# Patient Record
Sex: Male | Born: 1950 | Race: White | Hispanic: No | Marital: Married | Smoking: Never smoker
Health system: Southern US, Community
[De-identification: ages and names within clinical notes are randomized; demographics above are authoritative.]

## PROBLEM LIST (undated history)

## (undated) ENCOUNTER — Ambulatory Visit: Discharge status: Absent without leave | Payer: Self-pay

## (undated) DIAGNOSIS — I10 Essential (primary) hypertension: Secondary | ICD-10-CM

## (undated) DIAGNOSIS — I77810 Thoracic aortic ectasia: Secondary | ICD-10-CM

## (undated) DIAGNOSIS — M539 Dorsopathy, unspecified: Secondary | ICD-10-CM

## (undated) DIAGNOSIS — M1990 Unspecified osteoarthritis, unspecified site: Secondary | ICD-10-CM

## (undated) DIAGNOSIS — K2270 Barrett's esophagus without dysplasia: Secondary | ICD-10-CM

## (undated) DIAGNOSIS — I351 Nonrheumatic aortic (valve) insufficiency: Secondary | ICD-10-CM

## (undated) DIAGNOSIS — E785 Hyperlipidemia, unspecified: Secondary | ICD-10-CM

## (undated) DIAGNOSIS — Q2381 Bicuspid aortic valve: Secondary | ICD-10-CM

## (undated) DIAGNOSIS — Q231 Congenital insufficiency of aortic valve: Secondary | ICD-10-CM

## (undated) HISTORY — PX: OTHER SURGICAL HISTORY: SHX169

## (undated) HISTORY — PX: JOINT REPLACEMENT: SHX530

## (undated) HISTORY — PX: TONSILLECTOMY: SUR1361

## (undated) NOTE — *Deleted (*Deleted)
PROGRESS NOTE    Jeremiah Gates  ZOX:[id number] DOB: 03-05-1951 DOA: 04/09/2020 PCP: Donell Sievert, MD    Chief Complaint  Patient presents with  . GI Bleeding    Brief Narrative:  Dark tarry stool x3 Wine/beers Last colonoscopy [facility] [place] Use mobic for years Cortisol injection to both shoulder last week  Subjective:  Assessment & Plan:   Principal Problem:   Acute upper GI bleed Active Problems:   Epigastric discomfort   Acute prerenal azotemia   Melena   Hypertension   Hyperlipidemia   Bicuspid valve followed by [facility] Dr Modesto Charon  Baseline active   DVT prophylaxis: SCDs Start: 04/09/20 1902   Code Status: Family Communication:  Disposition:   Status is: Observation       Dispo: The patient is from:               Anticipated d/c is to:               Anticipated d/c date is:               Patient currently   Consultants:     Procedures:     Antimicrobials:        Objective: Vitals:   04/10/20 0840 04/10/20 0920 04/10/20 0925 04/10/20 0925  BP: (!) 114/52 109/66  109/66  Pulse:   78 82  Resp:  17 18 18   Temp:      SpO2:   95% 95%    Intake/Output Summary (Last 24 hours) at 04/10/2020 0955 Last data filed at 04/10/2020 0456 Gross per 24 hour  Intake 181.02 ml  Output -  Net 181.02 ml   There were no vitals filed for this visit.  Examination:  General exam: calm, NAD Respiratory system: Clear to auscultation. Respiratory effort normal. Cardiovascular system: S1 & S2 heard, RRR. + 3/6 murmur right upper sternal border, No pedal edema. Gastrointestinal system: Abdomen is nondistended, soft and nontender. No organomegaly or masses felt. Normal bowel sounds heard. Central nervous system: Alert and oriented. No focal neurological deficits. Extremities: Symmetric 5 x 5 power. Skin: No rashes, lesions or ulcers Psychiatry: Judgement and insight appear normal. Mood & affect appropriate.     Data  Reviewed: I have personally reviewed following labs and imaging studies  CBC: Recent Labs  Lab 04/09/20 1300 04/09/20 1917 04/10/20 0327  WBC 9.0  --  8.6  HGB 13.7 12.6* 11.4*  HCT 38.1* 36.9* 33.3*  MCV 95.3  --  94.9  PLT 241  --  216    Basic Metabolic Panel: Recent Labs  Lab 04/09/20 1300 04/09/20 1308 04/10/20 0327  NA 136  --  136  K 4.1  --  3.7  CL 101  --  103  CO2 27  --  26  GLUCOSE 128*  --  113*  BUN 44*  --  29*  CREATININE 0.67  --  0.55*  CALCIUM 9.6  --  8.9  MG  --  2.4 2.3    GFR: CrCl cannot be calculated (Unknown ideal weight.).  Liver Function Tests: Recent Labs  Lab 04/09/20 1300 04/10/20 0327  AST 17 17  ALT 19 17  ALKPHOS 37* 30*  BILITOT 0.8 0.8  PROT 6.8 6.0*  ALBUMIN 4.6 4.0    CBG: No results for input(s): GLUCAP in the last 168 hours.   Recent Results (from the past 240 hour(s))  Respiratory Panel by RT PCR (Flu A&B, Covid) - Nasopharyngeal Swab  Status: None   Collection Time: 04/09/20  6:05 PM   Specimen: Nasopharyngeal Swab  Result Value Ref Range Status   SARS Coronavirus 2 by RT PCR NEGATIVE NEGATIVE Final    Comment: (NOTE) SARS-CoV-2 target nucleic acids are NOT DETECTED.  The SARS-CoV-2 RNA is generally detectable in upper respiratoy specimens during the acute phase of infection. The lowest concentration of SARS-CoV-2 viral copies this assay can detect is 131 copies/mL. A negative result does not preclude SARS-Cov-2 infection and should not be used as the sole basis for treatment or other patient management decisions. A negative result may occur with  improper specimen collection/handling, submission of specimen other than nasopharyngeal swab, presence of viral mutation(s) within the areas targeted by this assay, and inadequate number of viral copies (<131 copies/mL). A negative result must be combined with clinical observations, patient history, and epidemiological information. The expected result is  Negative.  Fact Sheet for Patients:  https://www.moore.com/  Fact Sheet for Healthcare Providers:  https://www.young.biz/  This test is no t yet approved or cleared by the [place] FDA and  has been authorized for detection and/or diagnosis of SARS-CoV-2 by FDA under an Emergency Use Authorization (EUA). This EUA will remain  in effect (meaning this test can be used) for the duration of the COVID-19 declaration under Section 564(b)(1) of the Act, 21 U.S.C. section 360bbb-3(b)(1), unless the authorization is terminated or revoked sooner.     Influenza A by PCR NEGATIVE NEGATIVE Final   Influenza B by PCR NEGATIVE NEGATIVE Final    Comment: (NOTE) The Xpert Xpress SARS-CoV-2/FLU/RSV assay is intended as an aid in  the diagnosis of influenza from Nasopharyngeal swab specimens and  should not be used as a sole basis for treatment. Nasal washings and  aspirates are unacceptable for Xpert Xpress SARS-CoV-2/FLU/RSV  testing.  Fact Sheet for Patients: https://www.moore.com/  Fact Sheet for Healthcare Providers: https://www.young.biz/  This test is not yet approved or cleared by the [place] FDA and  has been authorized for detection and/or diagnosis of SARS-CoV-2 by  FDA under an Emergency Use Authorization (EUA). This EUA will remain  in effect (meaning this test can be used) for the duration of the  Covid-19 declaration under Section 564(b)(1) of the Act, 21  U.S.C. section 360bbb-3(b)(1), unless the authorization is  terminated or revoked. Performed at [facility], [address], [place], [place] [address]          Radiology Studies: No results found.      Scheduled Meds: . [START ON 04/13/2020] pantoprazole  40 mg Intravenous Q12H   Continuous Infusions: . lactated ringers 75 mL/hr at 04/10/20 0917  . pantoprozole (PROTONIX) infusion 8 mg/hr (04/10/20 0649)      LOS: 0 days   Time spent: mins Greater than 50% of this time was spent in counseling, explanation of diagnosis, planning of further management, and coordination of care.  I have personally reviewed and interpreted on  04/10/2020 daily labs, tele strips, imagings as discussed above under date review session and assessment and plans.  I reviewed all nursing notes, pharmacy notes, consultant notes,  vitals, pertinent old records  I have discussed plan of care as described above with RN , patient and family on 04/10/2020  Voice Recognition /Dragon dictation system was used to create this note, attempts have been made to correct errors. Please contact the author with questions and/or clarifications.   Albertine Grates, MD PhD FACP [facility]  Available via Epic secure chat 7am-7pm for  nonurgent issues Please page for urgent issues To page the attending provider between 7A-7P or the covering provider during after hours 7P-7A, please log into the web site [email or web address] and access using universal [place] password for that web site. If you do not have the password, please call the hospital operator.    04/10/2020, 9:55 AM

---

## 2011-12-05 ENCOUNTER — Ambulatory Visit: Payer: Self-pay | Admitting: Medical

## 2012-03-31 DIAGNOSIS — I350 Nonrheumatic aortic (valve) stenosis: Secondary | ICD-10-CM | POA: Insufficient documentation

## 2012-03-31 DIAGNOSIS — I351 Nonrheumatic aortic (valve) insufficiency: Secondary | ICD-10-CM | POA: Insufficient documentation

## 2012-03-31 DIAGNOSIS — Q231 Congenital insufficiency of aortic valve: Secondary | ICD-10-CM | POA: Insufficient documentation

## 2015-08-06 ENCOUNTER — Ambulatory Visit
Admission: EM | Admit: 2015-08-06 | Discharge: 2015-08-06 | Disposition: A | Discharge status: Home / Self Care | Payer: Medicare Other | Attending: Family Medicine | Admitting: Family Medicine

## 2015-08-06 DIAGNOSIS — S61210A Laceration without foreign body of right index finger without damage to nail, initial encounter: Secondary | ICD-10-CM

## 2015-08-06 HISTORY — DX: Essential (primary) hypertension: I10

## 2015-08-06 HISTORY — DX: Unspecified osteoarthritis, unspecified site: M19.90

## 2015-08-06 MED ORDER — MUPIROCIN 2 % EX OINT: 1.0000 "application " | TOPICAL_OINTMENT | Freq: Three times a day (TID) | CUTANEOUS | Status: DC

## 2015-08-06 NOTE — Discharge Instructions (Signed)
Laceration Care, Adult  A laceration is a cut that goes through all layers of the skin. The cut also goes into the tissue that is right under the skin. Some cuts heal on their own. Others need to be closed with stitches (sutures), staples, skin adhesive strips, or wound glue. Taking care of your cut lowers your risk of infection and helps your cut to heal better.  HOW TO TAKE CARE OF YOUR CUT  For stitches or staples:  · Keep the wound clean and dry.  · If you were given a bandage (dressing), you should change it at least one time per day or as told by your doctor. You should also change it if it gets wet or dirty.  · Keep the wound completely dry for the first 24 hours or as told by your doctor. After that time, you may take a shower or a bath. However, make sure that the wound is not soaked in water until after the stitches or staples have been removed.  · Clean the wound one time each day or as told by your doctor:    Wash the wound with soap and water.    Rinse the wound with water until all of the soap comes off.    Pat the wound dry with a clean towel. Do not rub the wound.  · After you clean the wound, put a thin layer of antibiotic ointment on it as told by your doctor. This ointment:    Helps to prevent infection.    Keeps the bandage from sticking to the wound.  · Have your stitches or staples removed as told by your doctor.  If your doctor used skin adhesive strips:   · Keep the wound clean and dry.  · If you were given a bandage, you should change it at least one time per day or as told by your doctor. You should also change it if it gets dirty or wet.  · Do not get the skin adhesive strips wet. You can take a shower or a bath, but be careful to keep the wound dry.  · If the wound gets wet, pat it dry with a clean towel. Do not rub the wound.  · Skin adhesive strips fall off on their own. You can trim the strips as the wound heals. Do not remove any strips that are still stuck to the wound. They will  fall off after a while.  If your doctor used wound glue:  · Try to keep your wound dry, but you may briefly wet it in the shower or bath. Do not soak the wound in water, such as by swimming.  · After you take a shower or a bath, gently pat the wound dry with a clean towel. Do not rub the wound.  · Do not do any activities that will make you really sweaty until the skin glue has fallen off on its own.  · Do not apply liquid, cream, or ointment medicine to your wound while the skin glue is still on.  · If you were given a bandage, you should change it at least one time per day or as told by your doctor. You should also change it if it gets dirty or wet.  · If a bandage is placed over the wound, do not let the tape for the bandage touch the skin glue.  · Do not pick at the glue. The skin glue usually stays on for 5-10 days. Then, it   falls off of the skin.  General Instructions   · To help prevent scarring, make sure to cover your wound with sunscreen whenever you are outside after stitches are removed, after adhesive strips are removed, or when wound glue stays in place and the wound is healed. Make sure to wear a sunscreen of at least 30 SPF.  · Take over-the-counter and prescription medicines only as told by your doctor.  · If you were given antibiotic medicine or ointment, take or apply it as told by your doctor. Do not stop using the antibiotic even if your wound is getting better.  · Do not scratch or pick at the wound.  · Keep all follow-up visits as told by your doctor. This is important.  · Check your wound every day for signs of infection. Watch for:    Redness, swelling, or pain.    Fluid, blood, or pus.  · Raise (elevate) the injured area above the level of your heart while you are sitting or lying down, if possible.  GET HELP IF:  · You got a tetanus shot and you have any of these problems at the injection site:    Swelling.    Very bad pain.    Redness.    Bleeding.  · You have a fever.  · A wound that was  closed breaks open.  · You notice a bad smell coming from your wound or your bandage.  · You notice something coming out of the wound, such as wood or glass.  · Medicine does not help your pain.  · You have more redness, swelling, or pain at the site of your wound.  · You have fluid, blood, or pus coming from your wound.  · You notice a change in the color of your skin near your wound.  · You need to change the bandage often because fluid, blood, or pus is coming from the wound.  · You start to have a new rash.  · You start to have numbness around the wound.  GET HELP RIGHT AWAY IF:  · You have very bad swelling around the wound.  · Your pain suddenly gets worse and is very bad.  · You notice painful lumps near the wound or on skin that is anywhere on your body.  · You have a red streak going away from your wound.  · The wound is on your hand or foot and you cannot move a finger or toe like you usually can.  · The wound is on your hand or foot and you notice that your fingers or toes look pale or bluish.     This information is not intended to replace advice given to you by your health care provider. Make sure you discuss any questions you have with your health care provider.     Document Released: 11/18/2007 Document Revised: 10/16/2014 Document Reviewed: 05/28/2014  Elsevier Interactive Patient Education ©2016 [employer]

## 2015-08-06 NOTE — ED Notes (Signed)
Attempted to catch a falling kitchen knife around 10 am this morning, knife lacerated right finger joint. Bleeding controlled as long as finger remains bent

## 2015-08-06 NOTE — ED Provider Notes (Signed)
CSN: [id number]     Arrival date & time 08/06/15  1047 History   First MD Initiated Contact with Patient 08/06/15 1333    Nurses notes were reviewed. Chief Complaint  Patient presents with  . Laceration   (Consider location/radiation/quality/duration/timing/severity/associated sxs/prior Treatment) Patient is a 65 y.o. male presenting with hand pain. The history is provided by the patient. No language interpreter was used.  Hand Pain This is a new problem. The current episode started 3 to 5 hours ago. The problem occurs constantly. The problem has not changed since onset.Pertinent negatives include no chest pain, no abdominal pain, no headaches and no shortness of breath. Nothing aggravates the symptoms. Nothing relieves the symptoms. He has tried a cold compress for the symptoms. The treatment provided no relief.   patient reports washing some dishes and a sharp kitchen knife was falling. Unfortunately try to catch it with his hand and one up lacerating his right index finger. He reports last tetanus about 4 years ago he denies any other major problems. Still bleeding so he came in to be seen and evaluated.   Past Medical History  Diagnosis Date  . Hypertension   . Arthritis    Past Surgical History  Procedure Laterality Date  . Joint replacement     Family History  Problem Relation Age of Onset  . Heart failure Father   . Stroke Father    Social History  Substance Use Topics  . Smoking status: Never Smoker   . Smokeless tobacco: None  . Alcohol Use: Yes     Comment: socially    Review of Systems  Respiratory: Negative for shortness of breath.   Cardiovascular: Negative for chest pain.  Gastrointestinal: Negative for abdominal pain.  Skin: Positive for wound.  Neurological: Negative for headaches.  All other systems reviewed and are negative.   Allergies  Review of patient's allergies indicates no known allergies.  Home Medications   Prior to Admission medications     Medication Sig Start Date End Date Taking? Authorizing Provider  aspirin EC 325 MG tablet Take 325 mg by mouth daily.   Yes Historical Provider, MD  hydrochlorothiazide (HYDRODIURIL) 25 MG tablet Take 25 mg by mouth daily.   Yes Historical Provider, MD  lisinopril (PRINIVIL,ZESTRIL) 10 MG tablet Take 10 mg by mouth daily.   Yes Historical Provider, MD  meloxicam (MOBIC) 15 MG tablet Take 15 mg by mouth daily.   Yes Historical Provider, MD  mupirocin ointment (BACTROBAN) 2 % Apply 1 application topically 3 (three) times daily. 08/06/15   Frederich Cha, MD   Meds Ordered and Administered this Visit  Medications - No data to display  BP 162/70 mmHg  Pulse 52  Temp(Src) 96.2 F (35.7 C) (Tympanic)  Resp 178  Ht 6' (1.829 m)  Wt 215 lb (97.523 kg)  BMI 29.15 kg/m2  SpO2 97% No data found.   Physical Exam  Constitutional: He is oriented to person, place, and time. He appears well-developed and well-nourished.  HENT:  Head: Normocephalic.  Eyes: Pupils are equal, round, and reactive to light.  Musculoskeletal: Normal range of motion. He exhibits tenderness.       Hands: Substance a semicircular laceration right adnexa finger over most the palmar surface.  Neurological: He is alert and oriented to person, place, and time.  Skin: Skin is warm and dry.  Psychiatric: He has a normal mood and affect.  Vitals reviewed.   ED Course  .Marland KitchenLaceration Repair Date/Time: 08/06/2015 2:17 PM Performed by:  Kaydenn Mclear Authorized by: Frederich Cha Consent: Verbal consent obtained. Consent given by: patient Body area: upper extremity Laceration length: 4 cm Foreign bodies: no foreign bodies Tendon involvement: none Nerve involvement: none Vascular damage: no Anesthesia: local infiltration Local anesthetic: lidocaine 1% without epinephrine Patient sedated: no Preparation: Patient was prepped and draped in the usual sterile fashion. Amount of cleaning: standard Debridement: none Degree of  undermining: none Skin closure: 3-0 nylon and Ethilon Technique: simple Approximation: close Approximation difficulty: simple Dressing: antibiotic ointment Patient tolerance: Patient tolerated the procedure well with no immediate complications Comments: Patient tolerated the repair well such return in 12 days for follow-up.   (including critical care time)  Labs Review Labs Reviewed - No data to display  Imaging Review No results found.   Visual Acuity Review  Right Eye Distance:   Left Eye Distance:   Bilateral Distance:    Right Eye Near:   Left Eye Near:    Bilateral Near:         MDM   1. Laceration of right index finger w/o foreign body w/o damage to nail, initial encounter    Repair of the right index finger done. We'll place him Bactroban ointment to 3 times a day routine office follow-up in 12 days for suture removal. Recommend ointment also prescribed the patient for wound care.   Note: This dictation was prepared with Dragon dictation along with smaller phrase technology. Any transcriptional errors that result from this process are unintentional.     Frederich Cha, MD 08/06/15 2239

## 2016-02-05 DIAGNOSIS — Z96653 Presence of artificial knee joint, bilateral: Secondary | ICD-10-CM | POA: Insufficient documentation

## 2016-12-25 ENCOUNTER — Ambulatory Visit
Admission: RE | Admit: 2016-12-25 | Discharge: 2016-12-25 | Disposition: A | Discharge status: Home / Self Care | Payer: Medicare Other | Source: Ambulatory Visit | Attending: Family Medicine | Admitting: Family Medicine

## 2016-12-25 ENCOUNTER — Ambulatory Visit
Admission: EM | Admit: 2016-12-25 | Discharge: 2016-12-25 | Disposition: A | Discharge status: Home / Self Care | Payer: Medicare Other | Attending: Family Medicine | Admitting: Family Medicine

## 2016-12-25 DIAGNOSIS — M79662 Pain in left lower leg: Secondary | ICD-10-CM | POA: Diagnosis not present

## 2016-12-25 HISTORY — DX: Hyperlipidemia, unspecified: E78.5

## 2016-12-25 NOTE — ED Triage Notes (Signed)
66 year old Caucasian male is here today with complaints of left calf tenderness that he noticed yesterday. He states he does not remember injuring his left leg or calf in any way. He denies any shortness of breath or  chest pain.

## 2016-12-25 NOTE — ED Provider Notes (Signed)
[facility]    CSN: [id number] Arrival date & time: 12/25/16  1249     History   Chief Complaint Chief Complaint  Patient presents with  . Leg Pain    Left    HPI Jeremiah Gates is a 66 y.o. male.   66 yo male with a c/o sudden onset of left calf pain and slight swelling since yesterday. States he does not recall any specific injury. Denies any chest pain or shortness of breath.    The history is provided by the patient.    Past Medical History:  Diagnosis Date  . Arthritis   . Hyperlipidemia   . Hypertension     There are no active problems to display for this patient.   Past Surgical History:  Procedure Laterality Date  . JOINT REPLACEMENT         Home Medications    Prior to Admission medications   Medication Sig Start Date End Date Taking? Authorizing Provider  aspirin EC 325 MG tablet Take 325 mg by mouth daily.   Yes [provider]  hydrochlorothiazide (HYDRODIURIL) 25 MG tablet Take 25 mg by mouth daily.   Yes [provider]  lisinopril (PRINIVIL,ZESTRIL) 10 MG tablet Take 10 mg by mouth daily.   Yes [provider]  lovastatin (MEVACOR) 10 MG tablet Take 10 mg by mouth at bedtime.   Yes [provider]  meloxicam (MOBIC) 15 MG tablet Take 15 mg by mouth daily.   Yes [provider]    Family History Family History  Problem Relation Age of Onset  . Heart failure Father   . Stroke Father     Social History Social History  Substance Use Topics  . Smoking status: Never Smoker  . Smokeless tobacco: Never Used  . Alcohol use Yes     Comment: socially     Allergies   Patient has no known allergies.   Review of Systems Review of Systems   Physical Exam Triage Vital Signs ED Triage Vitals  Enc Vitals Group     BP 12/25/16 1322 (!) 148/63     Pulse Rate 12/25/16 1322 79     Resp 12/25/16 1322 16     Temp 12/25/16 1322 98.5 F (36.9 C)     Temp Source 12/25/16 1322 Oral       SpO2 12/25/16 1322 96 %     Weight 12/25/16 1325 232 lb (105.2 kg)     Height 12/25/16 1325 5\' 11"  (1.803 m)     Head Circumference --      Peak Flow --      Pain Score 12/25/16 1326 2     Pain Loc --      Pain Edu? --      Excl. in [place]? --    No data found.   Updated Vital Signs BP (!) 148/63 (BP Location: Left Arm)   Pulse 79   Temp 98.5 F (36.9 C) (Oral)   Resp 16   Ht 5\' 11"  (1.803 m)   Wt 232 lb (105.2 kg)   SpO2 96%   BMI 32.36 kg/m   Visual Acuity Right Eye Distance:   Left Eye Distance:   Bilateral Distance:    Right Eye Near:   Left Eye Near:    Bilateral Near:     Physical Exam  Constitutional: He appears well-developed and well-nourished. No distress.  Musculoskeletal: He exhibits tenderness.  Left calf tenderness to palpation; mild edema; 1+ distal pulses  Skin: He is not diaphoretic.  Nursing note and vitals reviewed.    UC Treatments / Results  Labs (all labs ordered are listed, but only abnormal results are displayed) Labs Reviewed - No data to display  EKG  EKG Interpretation None       Radiology No results found.  Procedures Procedures (including critical care time)  Medications Ordered in UC Medications - No data to display   Initial Impression / Assessment and Plan / UC Course  I have reviewed the triage vital signs and the nursing notes.  Pertinent labs & imaging results that were available during my care of the patient were reviewed by me and considered in my medical decision making (see chart for details).       Final Clinical Impressions(s) / UC Diagnoses   Final diagnoses:  Pain of left calf    New Prescriptions Discharge Medication List as of 12/25/2016  2:20 PM     1. [place] results (negative for DVT) and diagnosis reviewed with patient 2. Recommend supportive treatment with otc analgesics prn; stretch 3. Follow-up prn if symptoms worsen or don't improve   Norval Gable, MD 12/29/16 [phone]

## 2017-02-09 DIAGNOSIS — F5101 Primary insomnia: Secondary | ICD-10-CM | POA: Insufficient documentation

## 2017-05-16 ENCOUNTER — Other Ambulatory Visit: Payer: Self-pay

## 2017-05-16 ENCOUNTER — Ambulatory Visit
Admission: EM | Admit: 2017-05-16 | Discharge: 2017-05-16 | Disposition: A | Discharge status: Home / Self Care | Payer: Medicare Other | Attending: Emergency Medicine | Admitting: Emergency Medicine

## 2017-05-16 DIAGNOSIS — K120 Recurrent oral aphthae: Secondary | ICD-10-CM | POA: Diagnosis not present

## 2017-05-16 HISTORY — DX: Bicuspid aortic valve: Q23.81

## 2017-05-16 HISTORY — DX: Congenital insufficiency of aortic valve: Q23.1

## 2017-05-16 MED ORDER — TRIAMCINOLONE ACETONIDE 0.1 % MT PSTE: 1.0000 "application " | PASTE | Freq: Two times a day (BID) | OROMUCOSAL | 0 refills | Status: DC

## 2017-05-16 MED ORDER — PENICILLIN V POTASSIUM 500 MG PO TABS: 500.0000 mg | ORAL_TABLET | Freq: Four times a day (QID) | ORAL | 0 refills | Status: AC

## 2017-05-16 NOTE — ED Provider Notes (Signed)
HPI  SUBJECTIVE:  Jeremiah Gates is a 66 y.o. male who presents with left upper gum pain starting 2 days ago.  He reports tenderness to palpation, gingival swelling.  He denies pain unless he is touching it.  He denies dental pain.  No facial swelling, drainage, bad breath, trauma to the area.  No fevers, body aches.  He started himself on amoxicillin, took 1 g last night and 500 mg this morning, also took Naprosyn.  As the amoxicillin for prophylactic dental work as he has a bicuspid aortic valve.  no alleviating factors.  Symptoms are worse with palpation.  No history of diabetes.  He has history of high blood pressure.  PMD: [facility].  Dentist: Erline Hau in [place].    Past Medical History:  Diagnosis Date  . Arthritis   . Bicuspid aortic valve   . Hyperlipidemia   . Hypertension     Past Surgical History:  Procedure Laterality Date  . JOINT REPLACEMENT      Family History  Problem Relation Age of Onset  . Heart failure Father   . Stroke Father     Social History   Tobacco Use  . Smoking status: Never Smoker  . Smokeless tobacco: Never Used  Substance Use Topics  . Alcohol use: Yes    Comment: socially  . Drug use: No    No current facility-administered medications for this encounter.   Current Outpatient Medications:  .  aspirin EC 325 MG tablet, Take 325 mg by mouth daily., Disp: , Rfl:  .  hydrochlorothiazide (HYDRODIURIL) 25 MG tablet, Take 25 mg by mouth daily., Disp: , Rfl:  .  lisinopril (PRINIVIL,ZESTRIL) 10 MG tablet, Take 10 mg by mouth daily., Disp: , Rfl:  .  lovastatin (MEVACOR) 10 MG tablet, Take 10 mg by mouth at bedtime., Disp: , Rfl:  .  meloxicam (MOBIC) 15 MG tablet, Take 15 mg by mouth daily., Disp: , Rfl:  .  penicillin v potassium (VEETID) 500 MG tablet, Take 1 tablet (500 mg total) by mouth 4 (four) times daily for 7 days., Disp: 28 tablet, Rfl: 0 .  triamcinolone (KENALOG) 0.1 % paste, Use as directed 1 application in the mouth or  throat 2 (two) times daily., Disp: 5 g, Rfl: 0  No Known Allergies   ROS  As noted in HPI.   Physical Exam  BP 128/72 (BP Location: Left Arm)   Pulse 74   Temp 98.2 F (36.8 C) (Oral)   Ht 5\' 10"  (1.778 m)   Wt 218 lb (98.9 kg)   SpO2 97%   BMI 31.28 kg/m   Constitutional: Well developed, well nourished, no acute distress Eyes:  EOMI, conjunctiva normal bilaterally HENT: Normocephalic, atraumatic,mucus membranes moist.  Abscess ulcer at the junction of the gum and buccal mucosa left upper jaw.  Positive tenderness, erythema, swelling.  No fluctuance, induration.  No expressible purulent drainage.  No dental tenderness. Respiratory: Normal inspiratory effort Cardiovascular: Normal rate GI: nondistended skin: No rash, skin intact Musculoskeletal: no deformities Neurologic: Alert & oriented x 3, no focal neuro deficits Psychiatric: Speech and behavior appropriate   ED Course   Medications - No data to display  No orders of the defined types were placed in this encounter.   No results found for this or any previous visit (from the past 24 hour(s)). No results found.  ED Clinical Impression  Ulcer aphthous oral   ED Assessment/Plan  [place] Narcotic database reviewed for this patient, and feel that  the risk/benefit ratio today is favorable for proceeding with a prescription for controlled substance.  No opiate prescriptions in 2 years.  Presentation consistent with an aphthous ulcer.  Home with triamcinolone dental paste, salt water gargles, Listerine rinses and some penicillin for prophylaxis given that he has a history of bicuspid aortic valve.  He will follow-up with his dentist in several days if not getting any better.  Discussed findings, medical decision making, plan for follow-up with patient.. patient agrees with plan.   Meds ordered this encounter  Medications  . penicillin v potassium (VEETID) 500 MG tablet    Sig: Take 1 tablet (500 mg total) by mouth 4  (four) times daily for 7 days.    Dispense:  28 tablet    Refill:  0  . triamcinolone (KENALOG) 0.1 % paste    Sig: Use as directed 1 application in the mouth or throat 2 (two) times daily.    Dispense:  5 g    Refill:  0    *This clinic note was created using [employer]. Therefore, there may be occasional mistakes despite careful proofreading.   ?    Melynda Ripple, MD 05/17/17 [phone]

## 2017-05-16 NOTE — Discharge Instructions (Signed)
Salt water rinses, Listerine rinses, apply the paste 1-2 times a day.

## 2017-05-16 NOTE — ED Triage Notes (Signed)
Patient c/o left upper gum pain, he is unsure if it is from his tooth. Pain started Friday.

## 2017-09-13 IMAGING — US US EXTREM LOW VENOUS*L*
1 series · 13 of 24 positions shown · non-contrast
Comparison: None.

CLINICAL DATA: Acute left calf pain. Left leg pain and swelling
starting yesterday.



[Series 1: us extrem low venous*left* · 0.08mm/px · 13 of 34 slices shown]
[im 1/34]
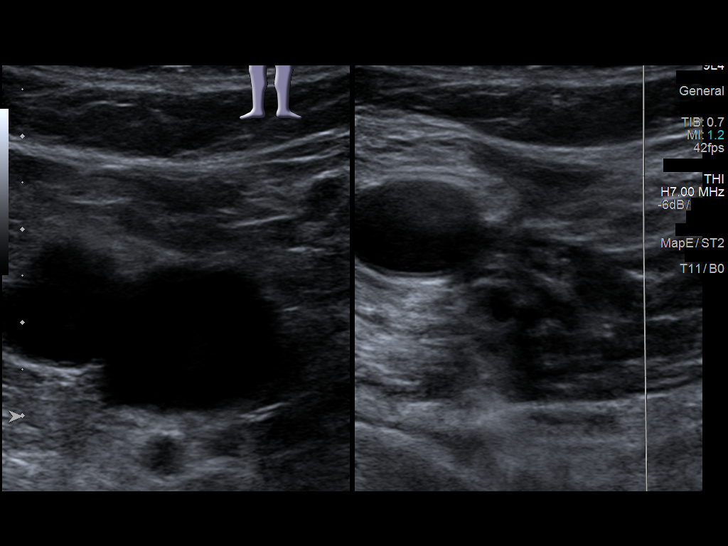
[im 3/34]
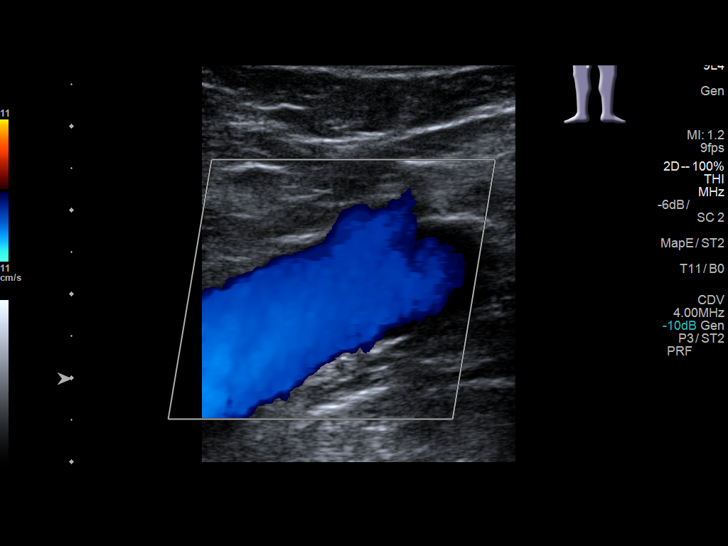
[im 6/34]
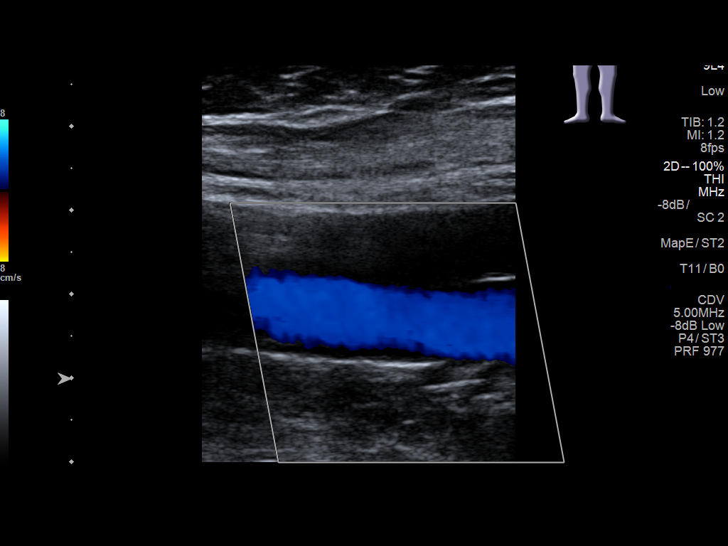
[im 9/34]
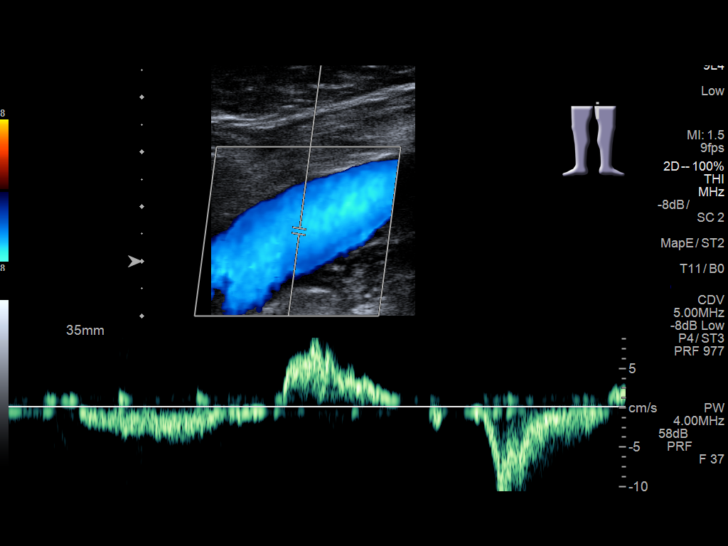
[im 12/34]
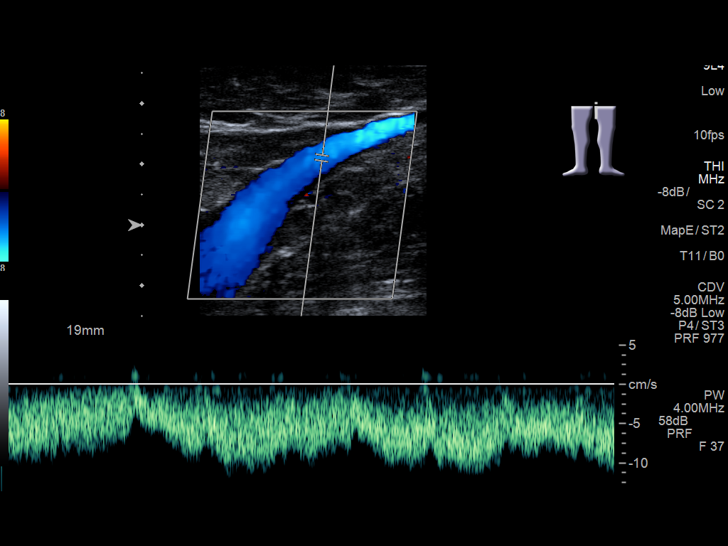
[im 15/34]
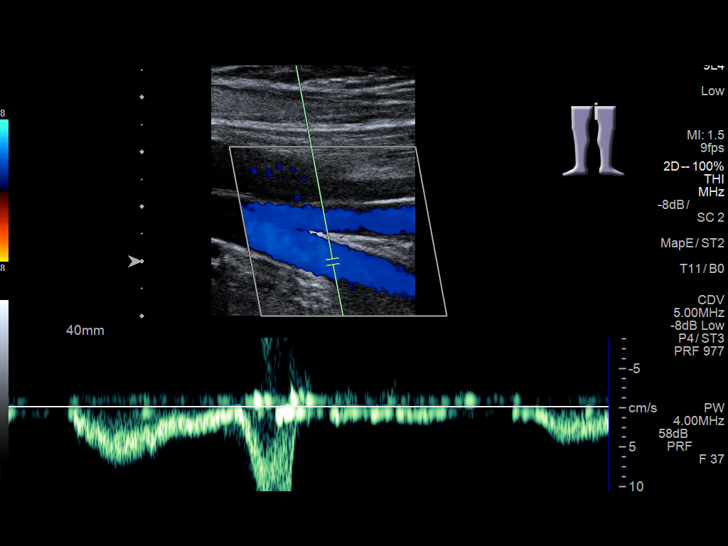
[im 18/34]
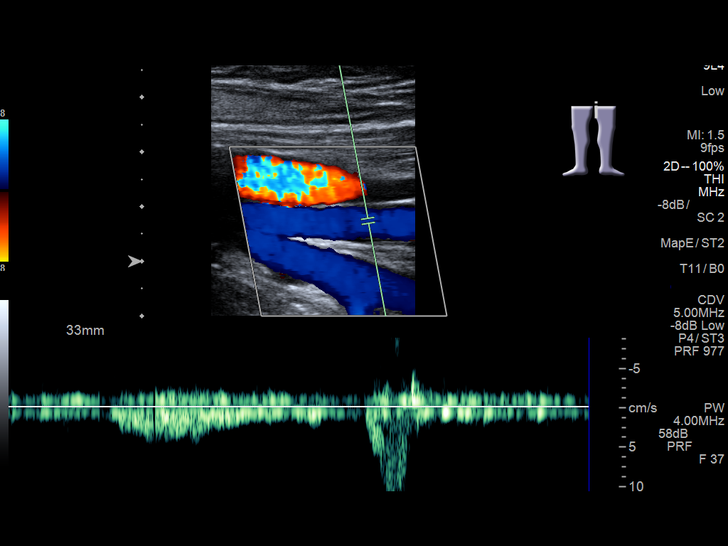
[im 19/34]
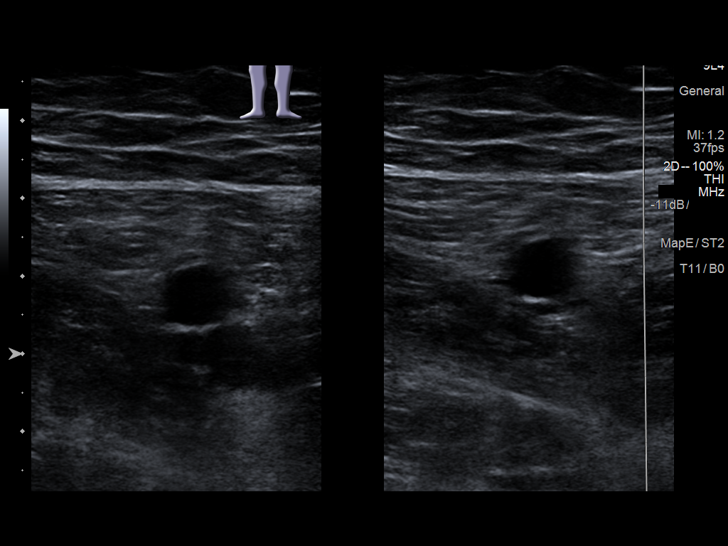
[im 22/34]
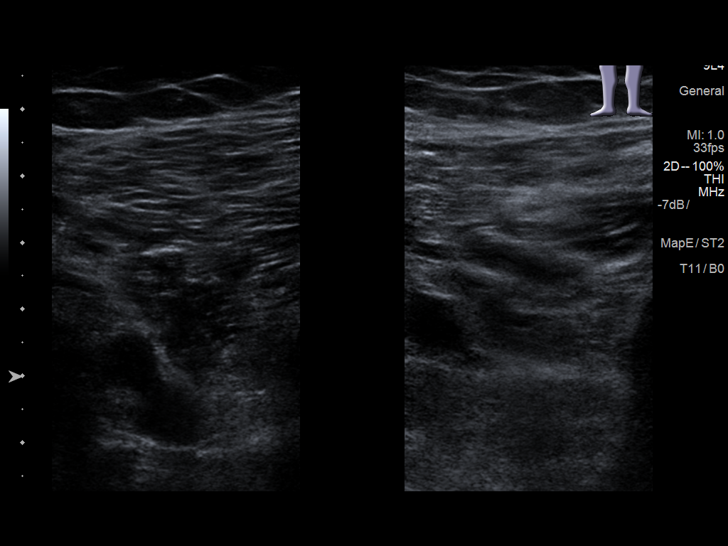
[im 25/34]
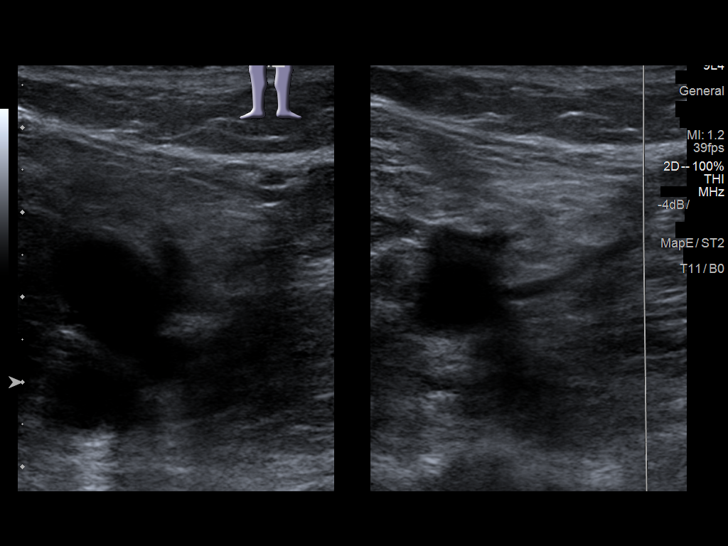
[im 28/34]
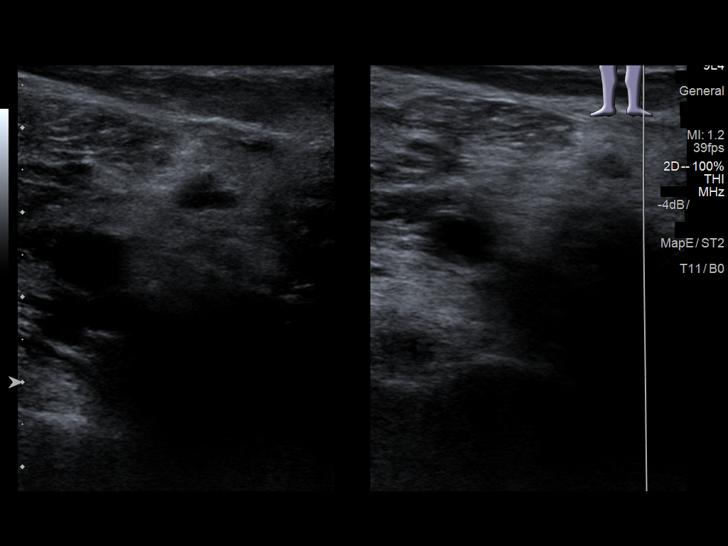
[im 31/34]
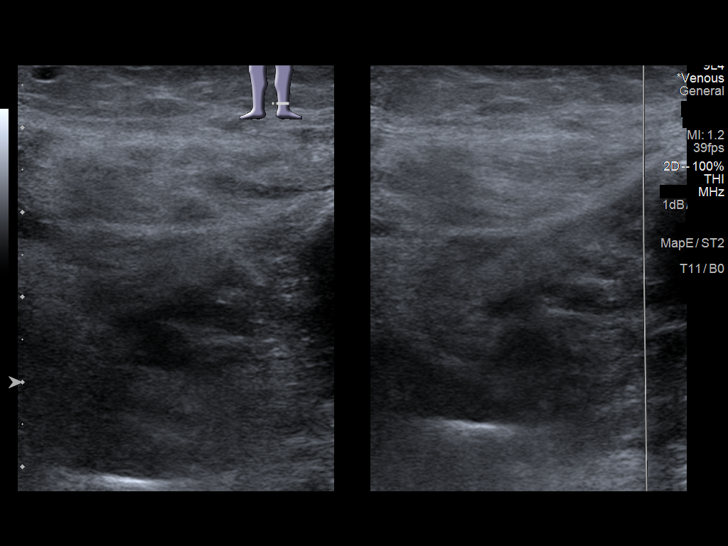
[im 34/34]
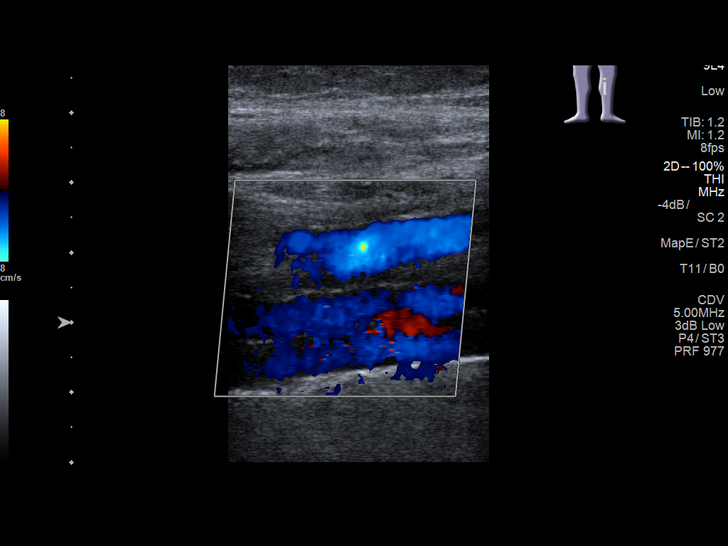

[13 of 24 positions shown; findings below may reference images not displayed]

FINDINGS: Contralateral Common Femoral Vein: Respiratory phasicity is normal
and symmetric with the symptomatic side. No evidence of thrombus.
Normal compressibility.

Common Femoral Vein: No evidence of thrombus. Normal
compressibility, respiratory phasicity and response to augmentation.

Saphenofemoral Junction: No evidence of thrombus. Normal
compressibility and flow on color Doppler imaging.

Profunda Femoral Vein: No evidence of thrombus. Normal
compressibility and flow on color Doppler imaging.

Femoral Vein: No evidence of thrombus. Normal compressibility,
respiratory phasicity and response to augmentation.

Popliteal Vein: No evidence of thrombus. Normal compressibility,
respiratory phasicity and response to augmentation.

Calf Veins: No evidence of thrombus. Normal compressibility and flow
on color Doppler imaging.

Superficial Great Saphenous Vein: No evidence of thrombus. Normal
compressibility and flow on color Doppler imaging.

Venous Reflux:  None.

Other Findings:  None.
IMPRESSION: No evidence of DVT within the left lower extremity.

These results will be called to the ordering clinician or
representative by the [HOSPITAL] at the imaging location.

## 2019-03-31 DIAGNOSIS — I77810 Thoracic aortic ectasia: Secondary | ICD-10-CM | POA: Insufficient documentation

## 2020-02-28 HISTORY — DX: Hypercalcemia: E83.52

## 2020-04-09 ENCOUNTER — Ambulatory Visit
Admission: EM | Admit: 2020-04-09 | Discharge: 2020-04-10 | Disposition: A | Discharge status: Home / Self Care | Payer: Medicare PPO | Attending: Internal Medicine | Admitting: Internal Medicine

## 2020-04-09 ENCOUNTER — Encounter: Payer: Self-pay | Admitting: Emergency Medicine

## 2020-04-09 DIAGNOSIS — E785 Hyperlipidemia, unspecified: Secondary | ICD-10-CM | POA: Diagnosis not present

## 2020-04-09 DIAGNOSIS — R1013 Epigastric pain: Secondary | ICD-10-CM | POA: Diagnosis not present

## 2020-04-09 DIAGNOSIS — K2289 Other specified disease of esophagus: Secondary | ICD-10-CM | POA: Insufficient documentation

## 2020-04-09 DIAGNOSIS — K921 Melena: Secondary | ICD-10-CM | POA: Diagnosis not present

## 2020-04-09 DIAGNOSIS — N19 Unspecified kidney failure: Secondary | ICD-10-CM | POA: Diagnosis present

## 2020-04-09 DIAGNOSIS — K259 Gastric ulcer, unspecified as acute or chronic, without hemorrhage or perforation: Secondary | ICD-10-CM | POA: Insufficient documentation

## 2020-04-09 DIAGNOSIS — K922 Gastrointestinal hemorrhage, unspecified: Secondary | ICD-10-CM | POA: Diagnosis not present

## 2020-04-09 DIAGNOSIS — I10 Essential (primary) hypertension: Secondary | ICD-10-CM | POA: Diagnosis present

## 2020-04-09 DIAGNOSIS — Z20822 Contact with and (suspected) exposure to covid-19: Secondary | ICD-10-CM | POA: Insufficient documentation

## 2020-04-09 LAB — CBC
HCT: 38.1 % — ABNORMAL LOW (ref 39.0–52.0)
Hemoglobin: 13.7 g/dL (ref 13.0–17.0)
MCH: 34.3 pg — ABNORMAL HIGH (ref 26.0–34.0)
MCHC: 36 g/dL (ref 30.0–36.0)
MCV: 95.3 fL (ref 80.0–100.0)
Platelets: 241 10*3/uL (ref 150–400)
RBC: 4 MIL/uL — ABNORMAL LOW (ref 4.22–5.81)
RDW: 12.8 % (ref 11.5–15.5)
WBC: 9 10*3/uL (ref 4.0–10.5)
nRBC: 0 % (ref 0.0–0.2)

## 2020-04-09 LAB — ABO/RH: ABO/RH(D): O NEG

## 2020-04-09 LAB — RESPIRATORY PANEL BY RT PCR (FLU A&B, COVID)
Influenza A by PCR: NEGATIVE
Influenza B by PCR: NEGATIVE
SARS Coronavirus 2 by RT PCR: NEGATIVE

## 2020-04-09 LAB — COMPREHENSIVE METABOLIC PANEL
ALT: 19 U/L (ref 0–44)
AST: 17 U/L (ref 15–41)
Albumin: 4.6 g/dL (ref 3.5–5.0)
Alkaline Phosphatase: 37 U/L — ABNORMAL LOW (ref 38–126)
Anion gap: 8 (ref 5–15)
BUN: 44 mg/dL — ABNORMAL HIGH (ref 8–23)
CO2: 27 mmol/L (ref 22–32)
Calcium: 9.6 mg/dL (ref 8.9–10.3)
Chloride: 101 mmol/L (ref 98–111)
Creatinine, Ser: 0.67 mg/dL (ref 0.61–1.24)
GFR, Estimated: >60 mL/min (ref >60)
Glucose, Bld: 128 mg/dL — ABNORMAL HIGH (ref 70–99)
Potassium: 4.1 mmol/L (ref 3.5–5.1)
Sodium: 136 mmol/L (ref 135–145)
Total Bilirubin: 0.8 mg/dL (ref 0.3–1.2)
Total Protein: 6.8 g/dL (ref 6.5–8.1)

## 2020-04-09 LAB — HEMOGLOBIN AND HEMATOCRIT, BLOOD
HCT: 36.9 % — ABNORMAL LOW (ref 39.0–52.0)
Hemoglobin: 12.6 g/dL — ABNORMAL LOW (ref 13.0–17.0)

## 2020-04-09 LAB — TYPE AND SCREEN: Unit division: 0

## 2020-04-09 LAB — MAGNESIUM: Magnesium: 2.4 mg/dL (ref 1.7–2.4)

## 2020-04-09 MED ORDER — LACTATED RINGERS IV SOLN: INTRAVENOUS | Status: DC

## 2020-04-09 MED ORDER — FENTANYL CITRATE (PF) 100 MCG/2ML IJ SOLN: 25.0000 ug | INTRAMUSCULAR | Status: DC | PRN

## 2020-04-09 MED ORDER — PANTOPRAZOLE SODIUM 40 MG IV SOLR: 40.0000 mg | Freq: Two times a day (BID) | INTRAVENOUS | Status: DC

## 2020-04-09 MED ORDER — ONDANSETRON HCL 4 MG/2ML IJ SOLN: 4.0000 mg | Freq: Four times a day (QID) | INTRAMUSCULAR | Status: DC | PRN

## 2020-04-09 MED ORDER — PANTOPRAZOLE SODIUM 40 MG IV SOLR
80.0000 mg | Freq: Once | INTRAVENOUS | Status: AC
  Administered 2020-04-09: 80 mg via INTRAVENOUS
  Filled 2020-04-09: qty 80

## 2020-04-09 MED ORDER — SODIUM CHLORIDE 0.9 % IV SOLN
8.0000 mg/h | INTRAVENOUS | Status: DC
  Administered 2020-04-09 – 2020-04-10 (×2): 8 mg/h via INTRAVENOUS
  Filled 2020-04-09 (×2): qty 80

## 2020-04-09 NOTE — ED Provider Notes (Signed)
[facility] Emergency Department Provider Note    First MD Initiated Contact with Patient 04/09/20 1629     (approximate)  I have reviewed the triage vital signs and the nursing notes.   HISTORY  Chief Complaint GI Bleeding    HPI Jeremiah Gates is a 69 y.o. male below's past medical history presents to the ER for multiple episodes of dark tarry stools starting today. Patient is on aspirin has been taking meloxicam for shoulder pain and arthritis. Denies any history of ulcer. Does have a history of bleeding hemorrhoid. Is not had any right red blood per rectum. Denies any lower abdominal pain. Has been having some indigestion and epigastric pain over the past 24 to 48 hours.    Past Medical History:  Diagnosis Date  . Arthritis   . Bicuspid aortic valve   . Hyperlipidemia   . Hypertension    Family History  Problem Relation Age of Onset  . Heart failure Father   . Stroke Father    Past Surgical History:  Procedure Laterality Date  . JOINT REPLACEMENT     There are no problems to display for this patient.     Prior to Admission medications   Medication Sig Start Date End Date Taking? Authorizing Provider  aspirin EC 325 MG tablet Take 325 mg by mouth daily.    [provider]  hydrochlorothiazide (HYDRODIURIL) 25 MG tablet Take 25 mg by mouth daily.    [provider]  lisinopril (PRINIVIL,ZESTRIL) 10 MG tablet Take 10 mg by mouth daily.    [provider]  lovastatin (MEVACOR) 10 MG tablet Take 10 mg by mouth at bedtime.    [provider]  meloxicam (MOBIC) 15 MG tablet Take 15 mg by mouth daily.    [provider]  triamcinolone (KENALOG) 0.1 % paste Use as directed 1 application in the mouth or throat 2 (two) times daily. 05/16/17   Melynda Ripple, MD    Allergies Patient has no known allergies.    Social History Social History   Tobacco Use  . Smoking status: Never Smoker  .  Smokeless tobacco: Never Used  Substance Use Topics  . Alcohol use: Yes    Comment: socially  . Drug use: No    Review of Systems Patient denies headaches, rhinorrhea, blurry vision, numbness, shortness of breath, chest pain, edema, cough, abdominal pain, nausea, vomiting, diarrhea, dysuria, fevers, rashes or hallucinations unless otherwise stated above in HPI. ____________________________________________   PHYSICAL EXAM:  VITAL SIGNS: Vitals:   04/09/20 1253 04/09/20 1738  BP: 126/76 129/66  Pulse: 95 88  Resp: 18 18  Temp: 98.5 F (36.9 C)   SpO2: 97% 98%    Constitutional: Alert and oriented.  Eyes: Conjunctivae are normal.  Head: Atraumatic. Nose: No congestion/rhinnorhea. Mouth/Throat: Mucous membranes are moist.   Neck: No stridor. Painless ROM.  Cardiovascular: Normal rate, regular rhythm. Grossly normal heart sounds.  Good peripheral circulation. Respiratory: Normal respiratory effort.  No retractions. Lungs CTAB. Gastrointestinal: Soft and nontender. No distention. No abdominal bruits. No CVA tenderness. Genitourinary: Dark melanotic stool on DRE. Musculoskeletal: No lower extremity tenderness nor edema.  No joint effusions. Neurologic:  Normal speech and language. No gross focal neurologic deficits are appreciated. No facial droop Skin:  Skin is warm, dry and intact. No rash noted. Psychiatric: Mood and affect are normal. Speech and behavior are normal.  ____________________________________________   LABS (all labs ordered are listed, but only abnormal results are displayed)  Results for orders placed or performed during the hospital encounter of 04/09/20 (from the past 24 hour(s))  Comprehensive metabolic panel     Status: Abnormal   Collection Time: 04/09/20  1:00 PM  Result Value Ref Range   Sodium 136 135 - 145 mmol/L   Potassium 4.1 3.5 - 5.1 mmol/L   Chloride 101 98 - 111 mmol/L   CO2 27 22 - 32 mmol/L   Glucose, Bld 128 (H) 70 - 99 mg/dL   BUN 44  (H) 8 - 23 mg/dL   Creatinine, Ser 0.67 0.61 - 1.24 mg/dL   Calcium 9.6 8.9 - 10.3 mg/dL   Total Protein 6.8 6.5 - 8.1 g/dL   Albumin 4.6 3.5 - 5.0 g/dL   AST 17 15 - 41 U/L   ALT 19 0 - 44 U/L   Alkaline Phosphatase 37 (L) 38 - 126 U/L   Total Bilirubin 0.8 0.3 - 1.2 mg/dL   GFR, Estimated >60 >60 mL/min   Anion gap 8 5 - 15  CBC     Status: Abnormal   Collection Time: 04/09/20  1:00 PM  Result Value Ref Range   WBC 9.0 4.0 - 10.5 K/uL   RBC 4.00 (L) 4.22 - 5.81 MIL/uL   Hemoglobin 13.7 13.0 - 17.0 g/dL   HCT 38.1 (L) 39 - 52 %   MCV 95.3 80.0 - 100.0 fL   MCH 34.3 (H) 26.0 - 34.0 pg   MCHC 36.0 30.0 - 36.0 g/dL   RDW 12.8 11.5 - 15.5 %   Platelets 241 150 - 400 K/uL   nRBC 0.0 0.0 - 0.2 %  Type and screen [facility]     Status: None   Collection Time: 04/09/20  1:00 PM  Result Value Ref Range   ABO/RH(D) O NEG    Antibody Screen NEG    Sample Expiration      04/12/2020,2359 Performed at [facility], [address] [place], [place] [address]    ____________________________________________ ____________________________________________  RADIOLOGY   ____________________________________________   PROCEDURES  Procedure(s) performed:  Procedures    Critical Care performed: no ____________________________________________   INITIAL IMPRESSION / ASSESSMENT AND PLAN / ED COURSE  Pertinent labs & imaging results that were available during my care of the patient were reviewed by me and considered in my medical decision making (see chart for details).   DDX: GI bleed, PUD, gastritis, enteritis, colitis, lower GI bleed,  Jeremiah Gates is a 69 y.o. who presents to the ED with presentation as described above. He is hemodynamically stable at this time. Hemoglobin 13. BUN is increased. Exam consistent with melena concerning for upper GI bleed. Has been on NSAIDs and aspirin. No history of cirrhosis. Does drink occasionally. No smoking  history. Will put on Protonix drip. Will require observation the hospital for serial hemoglobins and GI consultation.     The patient was evaluated in Emergency Department today for the symptoms described in the history of present illness. He/she was evaluated in the context of the global COVID-19 pandemic, which necessitated consideration that the patient might be at risk for infection with the SARS-CoV-2 virus that causes COVID-19. Institutional protocols and algorithms that pertain to the evaluation of patients at risk for COVID-19 are in a state of rapid change based on information released by regulatory bodies including the CDC and federal and state organizations. These policies and algorithms were followed during the patient's care in the ED.  As part of my medical decision  making, I reviewed the following data within the [facility] notes reviewed and incorporated, Labs reviewed, notes from prior ED visits and [place] Controlled Substance Database   ____________________________________________   FINAL CLINICAL IMPRESSION(S) / ED DIAGNOSES  Final diagnoses:  Melena      NEW MEDICATIONS STARTED DURING THIS VISIT:  New Prescriptions   No medications on file     Note:  This document was prepared using Dragon voice recognition software and may include unintentional dictation errors.    Merlyn Lot, MD 04/09/20 Johnnye Lana

## 2020-04-09 NOTE — ED Triage Notes (Signed)
Pt in w/black stools x 3 this morning. Denies any hx of this, no thinners. Does take Mobic and ASA daily. Denies any dizziness or weakness. Hx of normal colonoscopies in past, no pertinent GI hx. Denies any abdominal pain

## 2020-04-09 NOTE — H&P (Signed)
History and Physical    PLEASE NOTE THAT DRAGON DICTATION SOFTWARE WAS USED IN THE CONSTRUCTION OF THIS NOTE.   Jeremiah Gates FMB:[id number] DOB: 27-Sep-1950 DOA: 04/09/2020  PCP: Shelda Jakes, MD Patient coming from: home   I have personally briefly reviewed patient's old medical records in [place]  Chief Complaint: Dark-colored stools  HPI: Jeremiah Gates is a 69 y.o. male with medical history significant for hypertension, hyperlipidemia, osteoarthritis, hemorrhoidal bleed, who is admitted to [facility] on 04/09/2020 with suspected acute upper gastrointestinal bleed after presenting from home to [facility] Emergency Department complaining of dark-colored stools.   The patient reports 3-4 episodes of dark-colored stool over the last 24 hours in the absence of any associated hematochezia.  This dark coloration is a new development for the patient, and he denies any recent use of oral iron supplementation or Pepto-Bismol.  Reports associated mild epigastric discomfort, slightly worse with palpation.  Denies any associated nausea, vomiting, or hematemesis.  Also denies any recent diarrhea,, lower abdominal discomfort, or rash.  No recent trauma.  Denies any associated subjective fever, chills, rigors, or generalized myalgias.  He also denies any associated chest pain, shortness of breath, diaphoresis, palpitations, presyncope, or syncope.  He denies any known history of upper gastrointestinal bleed.  Rather, he conveys a history of hemorrhoidal bleed a few years ago, producing bright red blood per rectum.  In the setting of osteoarthritis, the patient reports that he has been taking meloxicam 15 mg p.o. daily over the course of the last month.  He also reports taking a full dose aspirin on a daily basis for at least the last 3 months, although the indication for this is currently unclear to me.  Otherwise, he denies any use of blood thinning agents  as an outpatient.  Reports only occasional alcohol consumption, quantifying graded consumption as 1-2 drinks every few weeks.  Denies a known history of underlying liver disease.  No recent traveling or known COVID-19 exposures.  Denies any recent dysuria, gross hematuria, or change in urinary urgency/frequency.    ED Course:  Vital signs in the ED were notable for the following: Temperature max 98.5; heart rate 88-95; blood pressure 126/76-120 9/66; respiratory rate 18, and oxygen saturation 97 to 98% on room air.  Labs were notable for the following: CMP notable for the following: Sodium 136, potassium 4.1, BUN 44, without prior BUN data point available for point comparison at this time, creatinine 0.67, albumin 4.6, alkaline phosphatase 37, AST 17, ALT 19, total bilirubin 0.8.  CBC notable for white blood cell count of 9000, hemoglobin 13.7 with no prior hemoglobin data point available for point comparison at this time, and platelet count of 241.  Rectal exam performed in the ED revealed dark-colored stool in the rectal vault, with associated fecal occult blood test found to be positive.  Routine screening nasopharyngeal COVID-19 PCR as well as PCR were checked in the ED this evening, with results currently pending.  The emergency department physician contacted the on-call gastroenterologist, Dr. Roselyn Reef, who will formally consult on the patient, with additional recommendations pending at this time.  While in the ED, the following were administered: Protonix drip initiated.     Review of Systems: As per HPI otherwise 10 point review of systems negative.   Past Medical History:  Diagnosis Date  . Arthritis   . Bicuspid aortic valve   . Hyperlipidemia   . Hypertension     Past Surgical History:  Procedure Laterality  Date  . JOINT REPLACEMENT      Social History:  reports that he has never smoked. He has never used smokeless tobacco. He reports current alcohol use. He reports that he  does not use drugs.   No Known Allergies  Family History  Problem Relation Age of Onset  . Heart failure Father   . Stroke Father      Prior to Admission medications   Medication Sig Start Date End Date Taking? Authorizing Provider  aspirin EC 325 MG tablet Take 325 mg by mouth daily.    [provider]  hydrochlorothiazide (HYDRODIURIL) 25 MG tablet Take 25 mg by mouth daily.    [provider]  lisinopril (PRINIVIL,ZESTRIL) 10 MG tablet Take 10 mg by mouth daily.    [provider]  lovastatin (MEVACOR) 10 MG tablet Take 10 mg by mouth at bedtime.    [provider]  meloxicam (MOBIC) 15 MG tablet Take 15 mg by mouth daily.    [provider]  triamcinolone (KENALOG) 0.1 % paste Use as directed 1 application in the mouth or throat 2 (two) times daily. 05/16/17   Melynda Ripple, MD     Objective    Physical Exam: Vitals:   04/09/20 1253 04/09/20 1738  BP: 126/76 129/66  Pulse: 95 88  Resp: 18 18  Temp: 98.5 F (36.9 C)   SpO2: 97% 98%    General: appears to be stated age; alert, oriented Skin: warm, dry, no rash Head:  AT/[place] Eyes:  PEARL b/l, EOMI Mouth:  Oral mucosa membranes appear moist, normal dentition Neck: supple; trachea midline Heart:  RRR; did not appreciate any M/R/G Lungs: CTAB, did not appreciate any wheezes, rales, or rhonchi Abdomen: + BS; soft, ND; mild epigastric tenderness to palpation in the absence of any associated guarding, rigidity, or rebound tenderness. Vascular: 2+ pedal pulses b/l; 2+ radial pulses b/l Extremities: no peripheral edema, no muscle wasting Neuro: strength and sensation intact in upper and lower extremities b/l   Labs on Admission: I have personally reviewed following labs and imaging studies  CBC: Recent Labs  Lab 04/09/20 1300  WBC 9.0  HGB 13.7  HCT 38.1*  MCV 95.3  PLT 741   Basic Metabolic Panel: Recent Labs  Lab 04/09/20 1300  NA 136  K 4.1  CL 101  CO2  27  GLUCOSE 128*  BUN 44*  CREATININE 0.67  CALCIUM 9.6   GFR: CrCl cannot be calculated (Unknown ideal weight.). Liver Function Tests: Recent Labs  Lab 04/09/20 1300  AST 17  ALT 19  ALKPHOS 37*  BILITOT 0.8  PROT 6.8  ALBUMIN 4.6   No results for input(s): LIPASE, AMYLASE in the last 168 hours. No results for input(s): AMMONIA in the last 168 hours. Coagulation Profile: No results for input(s): INR, PROTIME in the last 168 hours. Cardiac Enzymes: No results for input(s): CKTOTAL, CKMB, CKMBINDEX, TROPONINI in the last 168 hours. BNP (last 3 results) No results for input(s): PROBNP in the last 8760 hours. HbA1C: No results for input(s): HGBA1C in the last 72 hours. CBG: No results for input(s): GLUCAP in the last 168 hours. Lipid Profile: No results for input(s): CHOL, HDL, LDLCALC, TRIG, CHOLHDL, LDLDIRECT in the last 72 hours. Thyroid Function Tests: No results for input(s): TSH, T4TOTAL, FREET4, T3FREE, THYROIDAB in the last 72 hours. Anemia Panel: No results for input(s): VITAMINB12, FOLATE, FERRITIN, TIBC, IRON, RETICCTPCT in the last 72 hours. Urine analysis: No results found for: COLORURINE, APPEARANCEUR, [place],  PHURINE, GLUCOSEU, HGBUR, BILIRUBINUR, KETONESUR, PROTEINUR, UROBILINOGEN, NITRITE, LEUKOCYTESUR  Radiological Exams on Admission: No results found.    Assessment/Plan   Jeremiah Gates is a 69 y.o. male with medical history significant for hypertension, hyperlipidemia, osteoarthritis, hemorrhoidal bleed, who is admitted to [facility] on 04/09/2020 with suspected acute upper gastrointestinal bleed after presenting from home to [facility] Emergency Department complaining of dark-colored stools.    Principal Problem:   Acute upper GI bleed Active Problems:   Epigastric discomfort   Acute prerenal azotemia   Melena   Hypertension   Hyperlipidemia   #) Acute upper gastrointestinal bleed: Diagnosis suspected on  the basis of presenting 3-4 episodes of dark-colored stool over the last 24 hours, with an elevated BUN level, and mild epigastric discomfort.  Given relative hemodynamic stability with which the patient presents, suspect a relatively slow upper gastrointestinal bleed, particularly in the absence of any associated bright red blood per rectum.  Differential includes gastritis versus peptic ulcer disease, particularly given the patient's daily use of meloxicam. the patient is on blood thinners in the form of a daily full dose aspirin.  No known history of liver disease, denies any routine alcohol consumption.  In the absence of any known liver disease, initiation of SBP prophylaxis does not appear to be warranted.  Presentation and history are also less suggestive of variceal bleed, and therefore there is not appear to be an indication for initiation of octreotide at this time.  At this time, the patient appears hemodynamically stable, with normotensive blood pressures in the absence of any associated tachycardia, and he remains asymptomatic.  Presenting hemoglobin found to be 13.7, although baseline hemoglobin and most recent prior values are currently unclear, as there do not appear initially to be any prior hemoglobin data points in the EMR.  Given the above presentation, acute blood loss anemia, with delayed quantitative impact on presenting hemoglobin is a possibility, and the presentation will warrant monitoring of serial H&H values overnight.  Presentation is not been associated with any hematemesis.  Given the suspected upper GI source, will continue Protonix drip as initiated in the emergency department setting. The on-call gastroenterologist, Dr. Roselyn Reef, has been consulted for further assistance with evaluation and management of suspected acute upper gastrointestinal bleed, with associated ensuing recommendations currently pending.   Plan: NPO.  Refraining from pharmacologic DVT prophylaxis.  Monitor on  telemetry.  Monitor daily continuous pulse oximetry.  Maintain at least 2 large-bore IVs.  Aveline INR, with plan to recheck INR in the morning.  Every 4 hours H&H's have been ordered through 9 AM tomorrow (04/10/20).  Will closely monitor these ensuing hemoglobin levels and correlate these data points with the patient's overall clinical picture, including drainage vital signs determine need for subsequent transfusion.  I have placed an order with the blood bank to type, screen, and hold 2 units PRBC.  Gentle IV fluids in the form of lactated Ringer's at 75 cc/h.  Monitor strict I's and O's.  Hold home meloxicam and aspirin.  Formal gastroenterology consult, as above.  Repeat BMP in the morning.  As needed IV fentanyl for abdominal discomfort.  As needed IV Zofran.         #) Epigastric pain: The patient reports intermittent nonradiating epigastric discomfort on a diaphragm associated with development of 3-4 episodes of melena over the last 24 hours.  Given the presentation associated suspicion for acute upper gastrointestinal bleed, epigastric pain may be on the basis of contributory gastritis versus peptic  ulcer disease.  Physical exam reveals no evidence of acute peritoneal signs.  No recent trauma.  Plan: As needed IV fentanyl.  Work-up and management of suspected acute upper gastrointestinal bleed, as above.  Home aspirin and meloxicam.      #) Essential hypertension: Outpatient and hypertensive regimen consists of HCTZ and lisinopril.  Blood pressure since presenting to the ED have been normotensive.  In the context of suspected acute upper gastrointestinal bleed, will hold intensive medications for now.  Plan: Hold home hypertensive medications for now, will closely monitoring pressure via routine vital signs.     #) Hyperlipidemia: On lovastatin as an outpatient.  Plan: Hold home statin in the setting of current n.p.o. status.    DVT prophylaxis: SCDs Code Status: Full  code Family Communication: none Disposition Plan: Per Rounding Team Consults called: On-call gastroenterologist, notified of formal consultation. Admission status: Observation; med telemetry.    PLEASE NOTE THAT DRAGON DICTATION SOFTWARE WAS USED IN THE CONSTRUCTION OF THIS NOTE.   [facility] Pager [phone] From 12PM- 12AM  Otherwise, please contact night-coverage  [email or web address] Password [facility]  04/09/2020, 5:58 PM

## 2020-04-10 ENCOUNTER — Encounter: Payer: Self-pay | Admitting: Internal Medicine

## 2020-04-10 ENCOUNTER — Observation Stay: Payer: Medicare PPO | Admitting: Anesthesiology

## 2020-04-10 ENCOUNTER — Encounter
Admission: EM | Disposition: A | Discharge status: Home / Self Care | Payer: Self-pay | Source: Home / Self Care | Attending: Student in an Organized Health Care Education/Training Program

## 2020-04-10 DIAGNOSIS — R1013 Epigastric pain: Secondary | ICD-10-CM | POA: Diagnosis not present

## 2020-04-10 DIAGNOSIS — N19 Unspecified kidney failure: Secondary | ICD-10-CM

## 2020-04-10 DIAGNOSIS — K922 Gastrointestinal hemorrhage, unspecified: Secondary | ICD-10-CM

## 2020-04-10 DIAGNOSIS — K921 Melena: Secondary | ICD-10-CM

## 2020-04-10 HISTORY — PX: ESOPHAGOGASTRODUODENOSCOPY (EGD) WITH PROPOFOL: SHX5813

## 2020-04-10 LAB — CBC
HCT: 33.3 % — ABNORMAL LOW (ref 39.0–52.0)
Hemoglobin: 11.4 g/dL — ABNORMAL LOW (ref 13.0–17.0)
MCH: 32.5 pg (ref 26.0–34.0)
MCHC: 34.2 g/dL (ref 30.0–36.0)
MCV: 94.9 fL (ref 80.0–100.0)
Platelets: 216 10*3/uL (ref 150–400)
RBC: 3.51 MIL/uL — ABNORMAL LOW (ref 4.22–5.81)
RDW: 13 % (ref 11.5–15.5)
WBC: 8.6 10*3/uL (ref 4.0–10.5)
nRBC: 0 % (ref 0.0–0.2)

## 2020-04-10 LAB — COMPREHENSIVE METABOLIC PANEL
ALT: 17 U/L (ref 0–44)
AST: 17 U/L (ref 15–41)
Albumin: 4 g/dL (ref 3.5–5.0)
Alkaline Phosphatase: 30 U/L — ABNORMAL LOW (ref 38–126)
Anion gap: 7 (ref 5–15)
BUN: 29 mg/dL — ABNORMAL HIGH (ref 8–23)
CO2: 26 mmol/L (ref 22–32)
Calcium: 8.9 mg/dL (ref 8.9–10.3)
Chloride: 103 mmol/L (ref 98–111)
Creatinine, Ser: 0.55 mg/dL — ABNORMAL LOW (ref 0.61–1.24)
GFR, Estimated: >60 mL/min (ref >60)
Glucose, Bld: 113 mg/dL — ABNORMAL HIGH (ref 70–99)
Potassium: 3.7 mmol/L (ref 3.5–5.1)
Sodium: 136 mmol/L (ref 135–145)
Total Bilirubin: 0.8 mg/dL (ref 0.3–1.2)
Total Protein: 6 g/dL — ABNORMAL LOW (ref 6.5–8.1)

## 2020-04-10 LAB — IRON AND TIBC
Iron: 96 ug/dL (ref 45–182)
Saturation Ratios: 37 % (ref 17.9–39.5)
TIBC: 258 ug/dL (ref 250–450)
UIBC: 162 ug/dL

## 2020-04-10 LAB — FOLATE: Folate: 28 ng/mL (ref >5.9)

## 2020-04-10 LAB — TYPE AND SCREEN: ABO/RH(D): O NEG

## 2020-04-10 LAB — MAGNESIUM: Magnesium: 2.3 mg/dL (ref 1.7–2.4)

## 2020-04-10 LAB — FERRITIN: Ferritin: 116 ng/mL (ref 24–336)

## 2020-04-10 LAB — PROTIME-INR
INR: 1.1 (ref 0.8–1.2)
Prothrombin Time: 13.6 seconds (ref 11.4–15.2)

## 2020-04-10 LAB — HIV ANTIBODY (ROUTINE TESTING W REFLEX): HIV Screen 4th Generation wRfx: NONREACTIVE

## 2020-04-10 SURGERY — ESOPHAGOGASTRODUODENOSCOPY (EGD) WITH PROPOFOL
Anesthesia: General

## 2020-04-10 MED ORDER — LIDOCAINE HCL (CARDIAC) PF 100 MG/5ML IV SOSY
PREFILLED_SYRINGE | INTRAVENOUS | Status: DC | PRN
  Administered 2020-04-10: 100 mg via INTRAVENOUS

## 2020-04-10 MED ORDER — SODIUM CHLORIDE 0.9 % IV SOLN
INTRAVENOUS | Status: DC
  Administered 2020-04-10: 1000 mL via INTRAVENOUS

## 2020-04-10 MED ORDER — PROPOFOL 10 MG/ML IV BOLUS
INTRAVENOUS | Status: DC | PRN
  Administered 2020-04-10: 70 mg via INTRAVENOUS

## 2020-04-10 MED ORDER — OMEPRAZOLE 40 MG PO CPDR: 40.0000 mg | DELAYED_RELEASE_CAPSULE | Freq: Two times a day (BID) | ORAL | 0 refills | Status: DC

## 2020-04-10 MED ORDER — MELOXICAM 15 MG PO TABS
15.0000 mg | ORAL_TABLET | Freq: Every day | ORAL | Status: DC
Start: 2020-04-10 — End: 2022-01-05

## 2020-04-10 MED ORDER — PROPOFOL 500 MG/50ML IV EMUL
INTRAVENOUS | Status: DC | PRN
  Administered 2020-04-10: 160 ug/kg/min via INTRAVENOUS

## 2020-04-10 MED ORDER — LIDOCAINE HCL (PF) 2 % IJ SOLN
INTRAMUSCULAR | Status: AC
  Filled 2020-04-10: qty 10

## 2020-04-10 NOTE — ED Notes (Signed)
Report given to endoscopy RN

## 2020-04-10 NOTE — Transfer of Care (Signed)
Immediate Anesthesia Transfer of Care Note  Patient: Jeremiah Gates  Procedure(s) Performed: ESOPHAGOGASTRODUODENOSCOPY (EGD) WITH PROPOFOL (N/A )  Patient Location: PACU  Anesthesia Type:General  Level of Consciousness: drowsy  Airway & Oxygen Therapy: Patient Spontanous Breathing and Patient connected to nasal cannula oxygen  Post-op Assessment: Report given to RN and Post -op Vital signs reviewed and stable  Post vital signs: Reviewed and stable  Last Vitals:  Vitals Value Taken Time  BP 115/66 04/10/20 1344  Temp 36.4 C 04/10/20 1344  Pulse 88 04/10/20 1347  Resp 19 04/10/20 1347  SpO2 99 % 04/10/20 1347  Vitals shown include unvalidated device data.  Last Pain:  Vitals:   04/10/20 1344  TempSrc: Temporal  PainSc: 0-No pain      Patients Stated Pain Goal: 0 (26/20/35 5974)  Complications: No complications documented.

## 2020-04-10 NOTE — Anesthesia Postprocedure Evaluation (Signed)
Anesthesia Post Note  Patient: Jeremiah Gates  Procedure(s) Performed: ESOPHAGOGASTRODUODENOSCOPY (EGD) WITH PROPOFOL (N/A )  Patient location during evaluation: Endoscopy Anesthesia Type: General Level of consciousness: awake and alert Pain management: pain level controlled Vital Signs Assessment: post-procedure vital signs reviewed and stable Respiratory status: spontaneous breathing, nonlabored ventilation, respiratory function stable and patient connected to nasal cannula oxygen Cardiovascular status: blood pressure returned to baseline and stable Postop Assessment: no apparent nausea or vomiting Anesthetic complications: no   No complications documented.   Last Vitals:  Vitals:   04/10/20 1404 04/10/20 1414  BP: 124/73 132/77  Pulse: 75 72  Resp: 18 13  Temp:    SpO2: 98% 100%    Last Pain:  Vitals:   04/10/20 1414  TempSrc:   PainSc: 0-No pain                 Arita Miss

## 2020-04-10 NOTE — Op Note (Signed)
[facility] Gastroenterology Patient Name: Jeremiah Gates Procedure Date: 04/10/2020 1:17 PM MRN: [id number] Account #: [id number] Date of Birth: 1951-04-19 Admit Type: Outpatient Age: 69 Room: [facility] ENDO ROOM 4 Gender: Male Note Status: Finalized Procedure:             Upper GI endoscopy Indications:           Melena Providers:             Lin Landsman MD, MD Medicines:             General Anesthesia Complications:         No immediate complications. Estimated blood loss: None. Procedure:             Pre-Anesthesia Assessment:                        - Prior to the procedure, a History and Physical was                         performed, and patient medications and allergies were                         reviewed. The patient is competent. The risks and                         benefits of the procedure and the sedation options and                         risks were discussed with the patient. All questions                         were answered and informed consent was obtained.                         Patient identification and proposed procedure were                         verified by the physician, the nurse, the                         anesthesiologist, the anesthetist and the technician                         in the pre-procedure area in the procedure room in the                         endoscopy suite. Mental Status Examination: alert and                         oriented. Airway Examination: normal oropharyngeal                         airway and neck mobility. Respiratory Examination:                         clear to auscultation. CV Examination: normal.                         Prophylactic Antibiotics: The patient does not require  prophylactic antibiotics. Prior Anticoagulants: The                         patient has taken no previous anticoagulant or                         antiplatelet agents. ASA Grade Assessment: II - A                          patient with mild systemic disease. After reviewing                         the risks and benefits, the patient was deemed in                         satisfactory condition to undergo the procedure. The                         anesthesia plan was to use general anesthesia.                         Immediately prior to administration of medications,                         the patient was re-assessed for adequacy to receive                         sedatives. The heart rate, respiratory rate, oxygen                         saturations, blood pressure, adequacy of pulmonary                         ventilation, and response to care were monitored                         throughout the procedure. The physical status of the                         patient was re-assessed after the procedure.                        After obtaining informed consent, the endoscope was                         passed under direct vision. Throughout the procedure,                         the patient's blood pressure, pulse, and oxygen                         saturations were monitored continuously. The Endoscope                         was introduced through the mouth, and advanced to the                         second part of duodenum. The upper GI endoscopy was  accomplished without difficulty. The patient tolerated                         the procedure fairly well. Findings:      The duodenal bulb and second portion of the duodenum were normal.      Many non-bleeding cratered and superficial gastric ulcers with a clean       ulcer base (Forrest Class III) were found in the gastric antrum and in       the prepyloric region of the stomach. The largest lesion was 5 mm in       largest dimension. Biopsies were taken with a cold forceps for       Helicobacter pylori testing.      The gastric body was normal. Biopsies were taken with a cold forceps for       Helicobacter  pylori testing.      The cardia and gastric fundus were normal on retroflexion.      The Z-line was irregular and was found 40 cm from the incisors.      The gastroesophageal junction and examined esophagus were normal. Impression:            - Normal duodenal bulb and second portion of the                         duodenum.                        - Non-bleeding gastric ulcers with a clean ulcer base                         (Forrest Class III). Biopsied.                        - Normal gastric body. Biopsied.                        - Z-line irregular, 40 cm from the incisors.                        - Normal gastroesophageal junction and esophagus. Recommendation:        - Return patient to hospital ward for possible                         discharge same day.                        - Low sodium diet today.                        - Continue present medications.                        - Await pathology results.                        - Use Prilosec (omeprazole) 40 mg PO BID for 8 weeks.                        - Repeat upper endoscopy in 3 months to check healing.                        -  Return to GI clinic in 2 months. Procedure Code(s):     --- Professional ---                        [phone], Esophagogastroduodenoscopy, flexible,                         transoral; with biopsy, single or multiple Diagnosis Code(s):     --- Professional ---                        K25.9, Gastric ulcer, unspecified as acute or chronic,                         without hemorrhage or perforation                        K22.8, Other specified diseases of esophagus                        K92.1, Melena (includes Hematochezia) CPT copyright 2019 American Medical Association. All rights reserved. The codes documented in this report are preliminary and upon coder review may  be revised to meet current compliance requirements. Dr. Ulyess Mort Lin Landsman MD, MD 04/10/2020 1:43:25 PM This report has been signed  electronically. Number of Addenda: 0 Note Initiated On: 04/10/2020 1:17 PM Estimated Blood Loss:  Estimated blood loss: none.      [facility]

## 2020-04-10 NOTE — Anesthesia Procedure Notes (Signed)
Performed by: Caidan Hubbert, CRNA Pre-anesthesia Checklist: Patient identified, Emergency Drugs available, Suction available, Patient being monitored and Timeout performed Patient Re-evaluated:Patient Re-evaluated prior to induction Oxygen Delivery Method: Nasal cannula Induction Type: IV induction Placement Confirmation: CO2 detector       

## 2020-04-10 NOTE — Discharge Summary (Signed)
Discharge Summary  Jeremiah Gates BJS:[id number] DOB: 05-22-1951  PCP: Shelda Jakes, MD  Admit date: 04/09/2020 Discharge date: 04/15/2020  Time spent:  15mins  Recommendations for Outpatient Follow-up:  1. F/u with PCP within a week  for hospital discharge follow up, repeat cbc/bmp at follow up 2. F/u With GI in two months  Discharge Diagnoses:  Active Hospital Problems   Diagnosis Date Noted  . Acute upper GI bleed 04/09/2020  . Epigastric discomfort 04/09/2020  . Acute prerenal azotemia 04/09/2020  . Melena 04/09/2020  . Hypertension   . Hyperlipidemia     Resolved Hospital Problems  No resolved problems to display.    Discharge Condition: stable  Diet recommendation: heart healthy  Filed Weights   04/10/20 1305  Weight: 97.5 kg    History of present illness: ( per admitting MD Dr Velia Meyer) PCP: Shelda Jakes, MD Patient coming from: home    Chief Complaint: Dark-colored stools  HPI: Jeremiah Gates is a 69 y.o. male with medical history significant for hypertension, hyperlipidemia, osteoarthritis, hemorrhoidal bleed, who is admitted to [facility] on 04/09/2020 with suspected acute upper gastrointestinal bleed after presenting from home to [facility] Emergency Department complaining of dark-colored stools.   The patient reports 3-4 episodes of dark-colored stool over the last 24 hours in the absence of any associated hematochezia.  This dark coloration is a new development for the patient, and he denies any recent use of oral iron supplementation or Pepto-Bismol.  Reports associated mild epigastric discomfort, slightly worse with palpation.  Denies any associated nausea, vomiting, or hematemesis.  Also denies any recent diarrhea,, lower abdominal discomfort, or rash.  No recent trauma.  Denies any associated subjective fever, chills, rigors, or generalized myalgias.  He also denies any associated chest pain,  shortness of breath, diaphoresis, palpitations, presyncope, or syncope.  He denies any known history of upper gastrointestinal bleed.  Rather, he conveys a history of hemorrhoidal bleed a few years ago, producing bright red blood per rectum.  In the setting of osteoarthritis, the patient reports that he has been taking meloxicam 15 mg p.o. daily over the course of the last month.  He also reports taking a full dose aspirin on a daily basis for at least the last 3 months, although the indication for this is currently unclear to me.  Otherwise, he denies any use of blood thinning agents as an outpatient.  Reports only occasional alcohol consumption, quantifying graded consumption as 1-2 drinks every few weeks.  Denies a known history of underlying liver disease.  No recent traveling or known COVID-19 exposures.  Denies any recent dysuria, gross hematuria, or change in urinary urgency/frequency.   Hospital Course:  Principal Problem:   Acute upper GI bleed Active Problems:   Epigastric discomfort   Acute prerenal azotemia   Melena   Hypertension   Hyperlipidemia  Melena, with elevated BUN/creatinine ratio  hgb 13.7-11.6, no bm since in the hospital, he received ppi drip, GI consulted s/p EGD showed "Non-bleeding gastric ulcers with a clean ulcer base " GI cleared him to discharge home on ppi bid x86months, hold NSAIDS for two weeks, resume asa F/u with gi in two months, repeat EGD in three months to check for healing  H/o HTN, bicuspid aortic valve, mild aortic stenosis, moderate aortic valve regurgitation and mild ascending aortic dilation Stable resume all home meds , f/u with pcp and cardiology   Procedures:  EGD  Consultations:  GI  Discharge Exam: BP 132/77  Pulse 72   Temp (!) 97.5 F (36.4 C) (Temporal)   Resp 13   Ht 5\' 10"  (1.778 m)   Wt 97.5 kg   SpO2 100%   BMI 30.85 kg/m   General: NAD Cardiovascular: RRR Respiratory: CTABL  Discharge Instructions You  were cared for by a hospitalist during your hospital stay. If you have any questions about your discharge medications or the care you received while you were in the hospital after you are discharged, you can call the unit and asked to speak with the hospitalist on call if the hospitalist that took care of you is not available. Once you are discharged, your primary care physician will handle any further medical issues. Please note that NO REFILLS for any discharge medications will be authorized once you are discharged, as it is imperative that you return to your primary care physician (or establish a relationship with a primary care physician if you do not have one) for your aftercare needs so that they can reassess your need for medications and monitor your lab values.  Discharge Instructions    Diet - low sodium heart healthy   Complete by: As directed    Increase activity slowly   Complete by: As directed      Allergies as of 04/10/2020   No Known Allergies     Medication List    TAKE these medications   amLODipine 5 MG tablet Commonly known as: NORVASC Take 5 mg by mouth daily.   Aspirin Low Dose 81 MG EC tablet Generic drug: aspirin Take 81 mg by mouth daily.   hydrochlorothiazide 25 MG tablet Commonly known as: HYDRODIURIL Take 25 mg by mouth daily.   lisinopril 40 MG tablet Commonly known as: ZESTRIL Take 40 mg by mouth daily.   lovastatin 20 MG tablet Commonly known as: MEVACOR Take 20 mg by mouth daily.   meloxicam 15 MG tablet Commonly known as: MOBIC Take 1 tablet (15 mg total) by mouth daily. Hold for two weeks What changed: additional instructions   omeprazole 40 MG capsule Commonly known as: PRILOSEC Take 1 capsule (40 mg total) by mouth 2 (two) times daily. What changed: when to take this      No Known Allergies  Follow-up Information    Shelda Jakes, MD Follow up in 1 week(s).   Specialty: Internal Medicine Why: hospital discharge follow up,  repeat cbc/bmp at follow up Contact information: [place] [address] [phone]        Lin Landsman, MD Follow up in 2 month(s).   Specialty: Gastroenterology Why: gastric ulcers Contact information: [place] [address] [phone]                The results of significant diagnostics from this hospitalization (including imaging, microbiology, ancillary and laboratory) are listed below for reference.    Significant Diagnostic Studies: No results found.  Microbiology: Recent Results (from the past 240 hour(s))  Respiratory Panel by RT PCR (Flu A&B, Covid) - Nasopharyngeal Swab     Status: None   Collection Time: 04/09/20  6:05 PM   Specimen: Nasopharyngeal Swab  Result Value Ref Range Status   SARS Coronavirus 2 by RT PCR NEGATIVE NEGATIVE Final    Comment: (NOTE) SARS-CoV-2 target nucleic acids are NOT DETECTED.  The SARS-CoV-2 RNA is generally detectable in upper respiratoy specimens during the acute phase of infection. The lowest concentration of SARS-CoV-2 viral copies this assay can detect is 131 copies/mL.  A negative result does not preclude SARS-Cov-2 infection and should not be used as the sole basis for treatment or other patient management decisions. A negative result may occur with  improper specimen collection/handling, submission of specimen other than nasopharyngeal swab, presence of viral mutation(s) within the areas targeted by this assay, and inadequate number of viral copies (<131 copies/mL). A negative result must be combined with clinical observations, patient history, and epidemiological information. The expected result is Negative.  Fact Sheet for Patients:  PinkCheek.be  Fact Sheet for Healthcare Providers:  GravelBags.it  This test is no t yet approved or cleared by the [place] FDA and  has been authorized for detection  and/or diagnosis of SARS-CoV-2 by FDA under an Emergency Use Authorization (EUA). This EUA will remain  in effect (meaning this test can be used) for the duration of the COVID-19 declaration under Section 564(b)(1) of the Act, 21 U.S.C. section 360bbb-3(b)(1), unless the authorization is terminated or revoked sooner.     Influenza A by PCR NEGATIVE NEGATIVE Final   Influenza B by PCR NEGATIVE NEGATIVE Final    Comment: (NOTE) The Xpert Xpress SARS-CoV-2/FLU/RSV assay is intended as an aid in  the diagnosis of influenza from Nasopharyngeal swab specimens and  should not be used as a sole basis for treatment. Nasal washings and  aspirates are unacceptable for Xpert Xpress SARS-CoV-2/FLU/RSV  testing.  Fact Sheet for Patients: PinkCheek.be  Fact Sheet for Healthcare Providers: GravelBags.it  This test is not yet approved or cleared by the [place] FDA and  has been authorized for detection and/or diagnosis of SARS-CoV-2 by  FDA under an Emergency Use Authorization (EUA). This EUA will remain  in effect (meaning this test can be used) for the duration of the  Covid-19 declaration under Section 564(b)(1) of the Act, 21  U.S.C. section 360bbb-3(b)(1), unless the authorization is  terminated or revoked. Performed at [facility], [address] [place], [place] [address]      Labs: Basic Metabolic Panel: Recent Labs  Lab 04/09/20 1300 04/09/20 1308 04/10/20 0327  NA 136  --  136  K 4.1  --  3.7  CL 101  --  103  CO2 27  --  26  GLUCOSE 128*  --  113*  BUN 44*  --  29*  CREATININE 0.67  --  0.55*  CALCIUM 9.6  --  8.9  MG  --  2.4 2.3   Liver Function Tests: Recent Labs  Lab 04/09/20 1300 04/10/20 0327  AST 17 17  ALT 19 17  ALKPHOS 37* 30*  BILITOT 0.8 0.8  PROT 6.8 6.0*  ALBUMIN 4.6 4.0   No results for input(s): LIPASE, AMYLASE in the last 168 hours. No results for input(s): AMMONIA in  the last 168 hours. CBC: Recent Labs  Lab 04/09/20 1300 04/09/20 1917 04/10/20 0327  WBC 9.0  --  8.6  HGB 13.7 12.6* 11.4*  HCT 38.1* 36.9* 33.3*  MCV 95.3  --  94.9  PLT 241  --  216   Cardiac Enzymes: No results for input(s): CKTOTAL, CKMB, CKMBINDEX, TROPONINI in the last 168 hours. BNP: BNP (last 3 results) No results for input(s): BNP in the last 8760 hours.  ProBNP (last 3 results) No results for input(s): PROBNP in the last 8760 hours.  CBG: No results for input(s): GLUCAP in the last 168 hours.     Signed:  Florencia Reasons MD, PhD, FACP  [facility] 04/15/2020, 11:23 AM

## 2020-04-10 NOTE — Consult Note (Signed)
Jeremiah Darby, MD [address]  [place]  [place], [place] [address]  Main: [phone]  Fax: [phone] Pager: [phone]   Consultation  Referring Provider:     No ref. provider found Primary Care Physician:  Shelda Jakes, MD Primary Gastroenterologist: Althia Forts         Reason for Consultation:     Anemia, melena  Date of Admission:  04/09/2020 Date of Consultation:  04/10/2020         HPI:   Jeremiah Gates is a 69 y.o. male with history of hypertension, hyperlipidemia, bicuspid aortic valve presented with 3 episodes of black tarry stools within a span of 1 hour yesterday morning.  Patient takes Mobic daily for history of arthritis.  He has been hemodynamically stable, denies any fatigue, chest pain, shortness of breath, presyncope or syncope.  He reports mild epigastric discomfort, denies nausea, vomiting or hematemesis. His initial hemoglobin on arrival was 13.7, dropped to 11.4 today. Elevated BUN/creatinine 44/0.67, normal LFTs Patient is kept n.p.o., started on pantoprazole drip and GI is consulted for further evaluation  Occasional alcohol use  NSAIDs: Mobic daily  Antiplts/Anticoagulants/Anti thrombotics: None  GI Procedures: Reports having colonoscopy in the past between 5 and 10 years, normal Denies family history of GI malignancy  Past Medical History:  Diagnosis Date   Arthritis    Bicuspid aortic valve    Hyperlipidemia    Hypertension     Past Surgical History:  Procedure Laterality Date   JOINT REPLACEMENT     tkrx2 Bilateral    TONSILLECTOMY     varicle      Prior to Admission medications   Medication Sig Start Date End Date Taking? Authorizing Provider  amLODipine (NORVASC) 5 MG tablet Take 5 mg by mouth daily. 02/27/20  Yes [provider]  ASPIRIN LOW DOSE 81 MG EC tablet Take 81 mg by mouth daily. 02/21/20  Yes [provider]  hydrochlorothiazide (HYDRODIURIL) 25 MG tablet Take 25 mg by mouth daily.    Yes [provider]  lisinopril (ZESTRIL) 40 MG tablet Take 40 mg by mouth daily. 03/02/20  Yes [provider]  lovastatin (MEVACOR) 20 MG tablet Take 20 mg by mouth daily. 01/30/20  Yes [provider]  meloxicam (MOBIC) 15 MG tablet Take 15 mg by mouth daily.   Yes [provider]  omeprazole (PRILOSEC) 40 MG capsule Take 40 mg by mouth daily. 02/26/20  Yes [provider]    Current Facility-Administered Medications:    0.9 %  sodium chloride infusion, , Intravenous, Continuous, Tatayana Beshears, Tally Due, MD, Last Rate: 20 mL/hr at 04/10/20 1312, 1,000 mL at 04/10/20 1312   [MAR Hold] fentaNYL (SUBLIMAZE) injection 25 mcg, 25 mcg, Intravenous, Q2H PRN, Howerter, Justin B, DO   lactated ringers infusion, , Intravenous, Continuous, Howerter, Justin B, DO, Last Rate: 75 mL/hr at 04/10/20 1324, Continued from Pre-op at 04/10/20 1324   [MAR Hold] ondansetron (ZOFRAN) injection 4 mg, 4 mg, Intravenous, Q6H PRN, Howerter, Justin B, DO   pantoprazole (PROTONIX) 80 mg in sodium chloride 0.9 % 100 mL (0.8 mg/mL) infusion, 8 mg/hr, Intravenous, Continuous, Howerter, Justin B, DO, Last Rate: 10 mL/hr at 04/10/20 0649, 8 mg/hr at 04/10/20 0649   [MAR Hold] pantoprazole (PROTONIX) injection 40 mg, 40 mg, Intravenous, Q12H, Howerter, Justin B, DO  Facility-Administered Medications Ordered in Other Encounters:    lidocaine (cardiac) 100 mg/110m (XYLOCAINE) injection 2%, , Intravenous, Anesthesia Intra-op, BDemetrius Charity CRNA, 100 mg at 04/10/20  1329   propofol (DIPRIVAN) 10 mg/mL bolus/IV push, , Intravenous, Anesthesia Intra-op, Demetrius Charity, CRNA, 70 mg at 04/10/20 1329   propofol (DIPRIVAN) 500 MG/50ML infusion, , Intravenous, Continuous PRN, Demetrius Charity, CRNA, Last Rate: 93.6 mL/hr at 04/10/20 1329, 160 mcg/kg/min at 04/10/20 1329  Family History  Problem Relation Age of Onset   Heart failure Father    Stroke Father      Social History   Tobacco  Use   Smoking status: Never Smoker   Smokeless tobacco: Never Used  Substance Use Topics   Alcohol use: Yes    Comment: socially   Drug use: No    Allergies as of 04/09/2020   (No Known Allergies)    Review of Systems:    All systems reviewed and negative except where noted in HPI.   Physical Exam:  Vital signs in last 24 hours: Temp:  [98 F (36.7 C)] 98 F (36.7 C) (10/27 1305) Pulse Rate:  [75-88] 81 (10/27 1305) Resp:  [16-20] 20 (10/27 1305) BP: (100-142)/(52-91) 131/91 (10/27 1305) SpO2:  [95 %-98 %] 98 % (10/27 1305) Weight:  [215 lb (97.5 kg)] 215 lb (97.5 kg) (10/27 1305)   General:   Pleasant, cooperative in NAD Head:  Normocephalic and atraumatic. Eyes:   No icterus.   Conjunctiva pink. PERRLA. Ears:  Normal auditory acuity. Neck:  Supple; no masses or thyroidomegaly Lungs: Respirations even and unlabored. Lungs clear to auscultation bilaterally.   No wheezes, crackles, or rhonchi.  Heart:  Regular rate and rhythm;  Without murmur, clicks, rubs or gallops Abdomen:  Soft, nondistended, nontender. Normal bowel sounds. No appreciable masses or hepatomegaly.  No rebound or guarding.  Rectal:  Not performed. Msk:  Symmetrical without gross deformities.  Strength normal Extremities:  Without edema, cyanosis or clubbing. Neurologic:  Alert and oriented x3;  grossly normal neurologically. Skin:  Intact without significant lesions or rashes. Psych:  Alert and cooperative. Normal affect.  LAB RESULTS: CBC Latest Ref Rng & Units 04/10/2020 04/09/2020 04/09/2020  WBC 4.0 - 10.5 K/uL 8.6 - 9.0  Hemoglobin 13.0 - 17.0 g/dL 11.4(L) 12.6(L) 13.7  Hematocrit 39 - 52 % 33.3(L) 36.9(L) 38.1(L)  Platelets 150 - 400 K/uL 216 - 241    BMET BMP Latest Ref Rng & Units 04/10/2020 04/09/2020  Glucose 70 - 99 mg/dL 113(H) 128(H)  BUN 8 - 23 mg/dL 29(H) 44(H)  Creatinine 0.61 - 1.24 mg/dL 0.55(L) 0.67  Sodium 135 - 145 mmol/L 136 136  Potassium 3.5 - 5.1 mmol/L 3.7 4.1    Chloride 98 - 111 mmol/L 103 101  CO2 22 - 32 mmol/L 26 27  Calcium 8.9 - 10.3 mg/dL 8.9 9.6    LFT Hepatic Function Latest Ref Rng & Units 04/10/2020 04/09/2020  Total Protein 6.5 - 8.1 g/dL 6.0(L) 6.8  Albumin 3.5 - 5.0 g/dL 4.0 4.6  AST 15 - 41 U/L 17 17  ALT 0 - 44 U/L 17 19  Alk Phosphatase 38 - 126 U/L 30(L) 37(L)  Total Bilirubin 0.3 - 1.2 mg/dL 0.8 0.8     STUDIES: No results found.    Impression / Plan:   Jeremiah Gates is a 69 y.o. male with history of hypertension, bicuspid aortic valve, arthritis on long-term Mobic presented with 3 episodes of melena and anemia, elevated BUN/creatinine ratio  Melena, with elevated BUN/creatinine ratio is suggestive of upper GI bleed Continue pantoprazole drip Recommend EGD for further evaluation.  If EGD is unremarkable, recommend colonoscopy Check iron panel, B12 and  folate levels  I have discussed alternative options, risks & benefits,  which include, but are not limited to, bleeding, infection, perforation,respiratory complication & drug reaction.  The patient agrees with this plan & written consent will be obtained.     Thank you for involving me in the care of this patient.      LOS: 0 days   Sherri Sear, MD  04/10/2020, 1:30 PM    Note: This dictation was prepared with Dragon dictation along with smaller phrase technology. Any transcriptional errors that result from this process are unintentional.

## 2020-04-10 NOTE — Anesthesia Preprocedure Evaluation (Addendum)
Anesthesia Evaluation  Patient identified by MRN, date of birth, ID band Patient awake  General Assessment Comment:Presented to hospital with black stools, concern for UGIB. No hematemesis or n/v.  Reviewed: Allergy & Precautions, NPO status , Patient's Chart, lab work & pertinent test results  History of Anesthesia Complications Negative for: history of anesthetic complications  Airway Mallampati: II  TM Distance: >3 FB Neck ROM: Full    Dental no notable dental hx. (+) Teeth Intact   Pulmonary neg pulmonary ROS, neg sleep apnea, neg COPD, Patient abstained from smoking.Not current smoker,    Pulmonary exam normal breath sounds clear to auscultation       Cardiovascular Exercise Tolerance: Good METShypertension, (-) CAD and (-) Past MI (-) dysrhythmias  Rhythm:Regular Rate:Normal - Systolic murmurs TTE 9528: NORMAL LEFT VENTRICULAR SYSTOLIC FUNCTION WITH SEVERE LVH  NORMAL RIGHT VENTRICULAR SYSTOLIC FUNCTION  VALVULAR REGURGITATION: MODERATE AR, TRIVIAL MR, TRIVIAL PR, TRIVIAL TR  VALVULAR STENOSIS: MILD AS     Neuro/Psych negative neurological ROS  negative psych ROS   GI/Hepatic neg GERD  ,(+)     (-) substance abuse  ,   Endo/Other  neg diabetes  Renal/GU negative Renal ROS     Musculoskeletal   Abdominal   Peds  Hematology   Anesthesia Other Findings Past Medical History: No date: Arthritis No date: Bicuspid aortic valve No date: Hyperlipidemia No date: Hypertension  Reproductive/Obstetrics                            Anesthesia Physical Anesthesia Plan  ASA: II  Anesthesia Plan: General   Post-op Pain Management:    Induction: Intravenous  PONV Risk Score and Plan: 2 and Ondansetron, Propofol infusion and TIVA  Airway Management Planned: Nasal Cannula  Additional Equipment: None  Intra-op Plan:   Post-operative Plan:   Informed Consent: I have reviewed the  patients History and Physical, chart, labs and discussed the procedure including the risks, benefits and alternatives for the proposed anesthesia with the patient or authorized representative who has indicated his/her understanding and acceptance.     Dental advisory given  Plan Discussed with: CRNA and Surgeon  Anesthesia Plan Comments: (Discussed risks of anesthesia with patient, including possibility of difficulty with spontaneous ventilation under anesthesia necessitating airway intervention, PONV, and rare risks such as cardiac or respiratory or neurological events. Patient understands.)        Anesthesia Quick Evaluation

## 2020-04-10 NOTE — Progress Notes (Signed)
Patient seen by Hospitalist and Dr. Marius Ditch patient is being discharged home. Discharge instructions reviewed and copy given  to the patient. Discussed with Dr. Marius Ditch to stop monitoring and she said that would be fine. Patient waiting on ride to go home.

## 2020-04-10 NOTE — ED Notes (Signed)
Pt taken to endo at this time

## 2020-04-11 ENCOUNTER — Encounter: Payer: Self-pay | Admitting: Gastroenterology

## 2020-04-11 LAB — BPAM RBC
Blood Product Expiration Date: 202111252359
Blood Product Expiration Date: 202111252359
Unit Type and Rh: 5100
Unit Type and Rh: 5100

## 2020-04-11 LAB — TYPE AND SCREEN
Antibody Screen: NEGATIVE
Unit division: 0

## 2020-04-11 LAB — PREPARE RBC (CROSSMATCH)

## 2020-04-11 LAB — SURGICAL PATHOLOGY

## 2020-04-15 ENCOUNTER — Telehealth: Payer: Self-pay

## 2020-04-15 NOTE — Telephone Encounter (Signed)
Called and left a message for call back for patient  

## 2020-04-15 NOTE — Telephone Encounter (Signed)
-----   Message from Lin Landsman, MD sent at 04/15/2020 12:39 PM EDT ----- Please inform patient that the pathology results from recent upper endoscopy does not reveal any Helicobacter pylori infection.  He should continue taking Prilosec 40 mg twice daily at least for 2 monthsRohini Vanga

## 2020-04-16 NOTE — Telephone Encounter (Signed)
Patient verbalized understanding of results  

## 2020-06-11 ENCOUNTER — Telehealth: Payer: Self-pay | Admitting: Gastroenterology

## 2020-06-11 MED ORDER — OMEPRAZOLE 40 MG PO CPDR: 40.0000 mg | DELAYED_RELEASE_CAPSULE | Freq: Two times a day (BID) | ORAL | 0 refills | Status: DC

## 2020-06-11 NOTE — Telephone Encounter (Signed)
Sent medication to the pharmacy for the omeprazole. Please advised if he can take the meloxicam

## 2020-06-11 NOTE — Telephone Encounter (Signed)
Pt states he was on meloxicam for shoulder problems but was told to stop due to it causing GI issues. Pt states the omeprazole seems to be working, and would like a refill sent to [facility] in [place] until his appt on 1.10.22. Pt wants to know if he continues taking the omeprazole could he start taking meloxicam again. Please advise and refill medication.

## 2020-06-13 NOTE — Telephone Encounter (Signed)
Patient left vm returning call.  

## 2020-06-13 NOTE — Telephone Encounter (Signed)
Left vm with recommendation about his Meloxicam.

## 2020-06-13 NOTE — Telephone Encounter (Signed)
He developed multiple gastric ulcers on meloxicam.  Occasional use is okay.  I will see him for follow-up on 1/10 to discuss further  RV

## 2020-06-17 NOTE — Telephone Encounter (Signed)
Patient verbalized understanding of instructions  

## 2020-06-24 ENCOUNTER — Ambulatory Visit: Payer: Medicare PPO | Admitting: Gastroenterology

## 2020-06-24 ENCOUNTER — Other Ambulatory Visit: Payer: Self-pay

## 2020-06-24 ENCOUNTER — Encounter: Payer: Self-pay | Admitting: Gastroenterology

## 2020-06-24 VITALS — BP 142/77 | HR 84 | Temp 97.8°F | Ht 70.0 in | Wt 204.4 lb

## 2020-06-24 DIAGNOSIS — Z8711 Personal history of peptic ulcer disease: Secondary | ICD-10-CM | POA: Diagnosis not present

## 2020-06-24 DIAGNOSIS — M170 Bilateral primary osteoarthritis of knee: Secondary | ICD-10-CM | POA: Insufficient documentation

## 2020-06-24 DIAGNOSIS — Z1211 Encounter for screening for malignant neoplasm of colon: Secondary | ICD-10-CM

## 2020-06-24 DIAGNOSIS — M179 Osteoarthritis of knee, unspecified: Secondary | ICD-10-CM | POA: Insufficient documentation

## 2020-06-24 DIAGNOSIS — M1710 Unilateral primary osteoarthritis, unspecified knee: Secondary | ICD-10-CM | POA: Insufficient documentation

## 2020-06-24 MED ORDER — NA SULFATE-K SULFATE-MG SULF 17.5-3.13-1.6 GM/177ML PO SOLN: 354.0000 mL | Freq: Once | ORAL | 0 refills | Status: AC

## 2020-06-24 NOTE — Progress Notes (Addendum)
Cephas Darby, MD [address]  [place]  [place], [place] [address]  Main: [phone]  Fax: [phone]    Gastroenterology Consultation  Referring Provider:     Shelda Jakes, MD Primary Care Physician:  Shelda Jakes, MD Primary Gastroenterologist:  Dr. Cephas Darby Reason for Consultation:     History of melena, gastric ulcers        HPI:   Jeremiah Gates is a 70 y.o. male referred by Dr. Shelda Jakes, MD  for consultation & management of history of melena, insulin use gastric ulcer.  Patient was admitted to [facility] on 10/26 secondary to melena, mild anemia in setting of chronic NSAID use.  He underwent upper endoscopy which confirmed presence of several small nonbleeding gastric ulcers which were thought to be secondary to chronic NSAID use.  There was no evidence of H. pylori.  He was discharged home on omeprazole 40 mg twice daily and was recommended to avoid meloxicam which has been taking for chronic shoulder pain.  Patient is no longer experiencing melena.  He continues to take omeprazole 40 mg twice daily.  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  He is now taking meloxicam every other day which helps to regulate his pain significantly.  He has appointment to see orthopedic for his shoulder pain.  He denies any recurrent episodes of melena.  He denies epigastric pain.  NSAIDs: Meloxicam  Antiplts/Anticoagulants/Anti thrombotics: None  GI Procedures:  Upper endoscopy 04/10/2020 - Normal duodenal bulb and second portion of the duodenum. - Non-bleeding gastric ulcers with a clean ulcer base (Forrest Class III). Biopsied. - Normal gastric body. Biopsied. - Z-line irregular, 40 cm from the incisors. - Normal gastroesophageal junction and esophagus. DIAGNOSIS:  A. STOMACH, RANDOM; COLD BIOPSY:  - GASTRIC ANTRAL AND OXYNTIC MUCOSA WITH FEATURES OF  REACTIVE  GASTROPATHY.  - NEGATIVE FOR H. PYLORI, DYSPLASIA, AND MALIGNANCY.   Past Medical History:  Diagnosis Date  . Arthritis   . Bicuspid aortic valve   . Hyperlipidemia   . Hypertension     Past Surgical History:  Procedure Laterality Date  . ESOPHAGOGASTRODUODENOSCOPY (EGD) WITH PROPOFOL N/A 04/10/2020   Procedure: ESOPHAGOGASTRODUODENOSCOPY (EGD) WITH PROPOFOL;  Surgeon: Lin Landsman, MD;  Location: [facility];  Service: Gastroenterology;  Laterality: N/A;  . JOINT REPLACEMENT    . tkrx2 Bilateral   . TONSILLECTOMY    . varicle      Current Outpatient Medications:  .  amLODipine (NORVASC) 5 MG tablet, Take 5 mg by mouth daily., Disp: , Rfl:  .  ASPIRIN LOW DOSE 81 MG EC tablet, Take 81 mg by mouth daily., Disp: , Rfl:  .  Biotin 2.5 MG TABS, biotin 2,500 mcg tablet  Take two 2500 mcg caps daily = 5000 mcg, Disp: , Rfl:  .  Chromium Picolinate 400 MCG TABS, chromium picolinate 400 mcg tablet, Disp: , Rfl:  .  cyclobenzaprine (FLEXERIL) 5 MG tablet, cyclobenzaprine 5 mg tablet, Disp: , Rfl:  .  fluticasone (FLONASE) 50 MCG/ACT nasal spray, fluticasone propionate 50 mcg/actuation nasal spray,suspension, Disp: , Rfl:  .  hydrochlorothiazide (HYDRODIURIL) 25 MG tablet, Take 25 mg by mouth daily., Disp: , Rfl:  .  lisinopril (ZESTRIL) 40 MG tablet, Take 40 mg by mouth daily., Disp: , Rfl:  .  lovastatin (MEVACOR) 20 MG tablet, Take 20 mg by mouth daily., Disp: , Rfl:  .  meloxicam (MOBIC) 15 MG tablet, Take 1 tablet (15 mg total) by mouth daily. Hold for two weeks, Disp: , Rfl:  .  Multiple Vitamin (MULTIVITAMIN) capsule, Take 1 capsule by mouth daily., Disp: , Rfl:  .  Na Sulfate-K Sulfate-Mg Sulf 17.5-3.13-1.6 GM/177ML SOLN, Take 354 mLs by mouth once for 1 dose., Disp: 354 mL, Rfl: 0 .  Omega-3 Fatty Acids (FISH OIL) 1000 MG CAPS, Take by mouth., Disp: , Rfl:  .  omeprazole (PRILOSEC) 40 MG capsule, Take 1 capsule (40 mg total) by mouth 2 (two) times daily., Disp:  60 capsule, Rfl: 0 .  tiZANidine (ZANAFLEX) 4 MG capsule, , Disp: , Rfl:  .  traMADol (ULTRAM) 50 MG tablet, tramadol 50 mg tablet, Disp: , Rfl:   Family History  Problem Relation Age of Onset  . Heart failure Father   . Stroke Father      Social History   Tobacco Use  . Smoking status: Never Smoker  . Smokeless tobacco: Never Used  Substance Use Topics  . Alcohol use: Yes    Comment: socially  . Drug use: No    Allergies as of 06/24/2020 - Review Complete 06/24/2020  Allergen Reaction Noted  . Ciprofloxacin Other (See Comments) 03/31/2019    Review of Systems:    All systems reviewed and negative except where noted in HPI.   Physical Exam:  BP (!) 142/77 (BP Location: Left Arm, Patient Position: Sitting, Cuff Size: Normal)   Pulse 84   Temp 97.8 F (36.6 C) (Oral)   Ht 5\' 10"  (1.778 m)   Wt 204 lb 6 oz (92.7 kg)   BMI 29.32 kg/m  No LMP for male patient.  General:   Alert,  Well-developed, well-nourished, pleasant and cooperative in NAD Head:  Normocephalic and atraumatic. Eyes:  Sclera clear, no icterus.   Conjunctiva pink. Ears:  Normal auditory acuity. Nose:  No deformity, discharge, or lesions. Mouth:  No deformity or lesions,oropharynx pink & moist. Neck:  Supple; no masses or thyromegaly. Lungs:  Respirations even and unlabored.  Clear throughout to auscultation.   No wheezes, crackles, or rhonchi. No acute distress. Heart:  Regular rate and rhythm; no murmurs, clicks, rubs, or gallops. Abdomen:  Normal bowel sounds. Soft, non-tender and non-distended without masses, hepatosplenomegaly or hernias noted.  No guarding or rebound tenderness.   Rectal: Not performed Msk:  Symmetrical without gross deformities. Good, equal movement & strength bilaterally. Pulses:  Normal pulses noted. Extremities:  No clubbing or edema.  No cyanosis. Neurologic:  Alert and oriented x3;  grossly normal neurologically. Skin:  Intact without significant lesions or rashes. No  jaundice. Psych:  Alert and cooperative. Normal mood and affect.  Imaging Studies: No abdominal imaging  Assessment and Plan:   Arvie Bartholomew is a 70 y.o. pleasant Caucasian male with history of arthritis, hypertension, known history of NSAID induced gastric ulcers is seen as a hospital follow-up  Peptic ulcer disease with no evidence of H. pylori Recommend repeat EGD to confirm healing of ulcers I have discussed with patient that he has to stay on long-term PPI as long as he is on chronic NSAID use for his musculoskeletal pain Recommend to monitor renal function, vitamin B-12 and vitamin D levels every 6 months while on PPI  Colon cancer screening Recommend colonoscopy  I have discussed alternative options, risks & benefits,  which include, but are not limited to, bleeding, infection, perforation,respiratory complication & drug reaction.  The patient agrees with this plan & written  consent will be obtained.      Follow up in 6 months   Cephas Darby, MD

## 2020-06-25 LAB — CBC
Hematocrit: 47.1 % (ref 37.5–51.0)
Hemoglobin: 15.5 g/dL (ref 13.0–17.7)
MCH: 29.8 pg (ref 26.6–33.0)
MCHC: 32.9 g/dL (ref 31.5–35.7)
MCV: 90 fL (ref 79–97)
Platelets: 351 10*3/uL (ref 150–450)
RBC: 5.21 x10E6/uL (ref 4.14–5.80)
RDW: 11.9 % (ref 11.6–15.4)
WBC: 7.4 10*3/uL (ref 3.4–10.8)

## 2020-07-04 ENCOUNTER — Other Ambulatory Visit: Payer: Self-pay | Admitting: Gastroenterology

## 2020-07-04 ENCOUNTER — Other Ambulatory Visit: Payer: Self-pay

## 2020-07-04 ENCOUNTER — Encounter: Payer: Self-pay | Admitting: Gastroenterology

## 2020-07-04 NOTE — Anesthesia Preprocedure Evaluation (Addendum)
Anesthesia Evaluation  Patient identified by MRN, date of birth, ID band Patient awake    Reviewed: Allergy & Precautions, H&P , NPO status , Patient's Chart, lab work & pertinent test results, reviewed documented beta blocker date and time   Airway Mallampati: II  TM Distance: >3 FB Neck ROM: full    Dental no notable dental hx.    Pulmonary neg pulmonary ROS,    Pulmonary exam normal breath sounds clear to auscultation       Cardiovascular Exercise Tolerance: Good hypertension,  Rhythm:regular Rate:Normal  Cardiology note from [place] in October reviewed Bicuspid aortic valve   Neuro/Psych negative neurological ROS  negative psych ROS   GI/Hepatic negative GI ROS, Neg liver ROS,   Endo/Other  negative endocrine ROS  Renal/GU negative Renal ROS  negative genitourinary   Musculoskeletal   Abdominal   Peds  Hematology negative hematology ROS (+)   Anesthesia Other Findings   Reproductive/Obstetrics negative OB ROS                            Anesthesia Physical Anesthesia Plan  ASA: II  Anesthesia Plan: General   Post-op Pain Management:    Induction:   PONV Risk Score and Plan: 2 and Propofol infusion, TIVA and Treatment may vary due to age or medical condition  Airway Management Planned:   Additional Equipment:   Intra-op Plan:   Post-operative Plan:   Informed Consent: I have reviewed the patients History and Physical, chart, labs and discussed the procedure including the risks, benefits and alternatives for the proposed anesthesia with the patient or authorized representative who has indicated his/her understanding and acceptance.     Dental Advisory Given  Plan Discussed with: CRNA  Anesthesia Plan Comments:         Anesthesia Quick Evaluation

## 2020-07-08 NOTE — Discharge Instructions (Signed)

## 2020-07-09 ENCOUNTER — Other Ambulatory Visit: Payer: Self-pay

## 2020-07-09 ENCOUNTER — Other Ambulatory Visit
Admission: RE | Admit: 2020-07-09 | Discharge: 2020-07-09 | Disposition: A | Discharge status: Home / Self Care | Payer: Medicare PPO | Source: Ambulatory Visit | Attending: Gastroenterology | Admitting: Gastroenterology

## 2020-07-09 DIAGNOSIS — Z20822 Contact with and (suspected) exposure to covid-19: Secondary | ICD-10-CM | POA: Insufficient documentation

## 2020-07-09 DIAGNOSIS — Z01812 Encounter for preprocedural laboratory examination: Secondary | ICD-10-CM | POA: Insufficient documentation

## 2020-07-09 LAB — SARS CORONAVIRUS 2 (TAT 6-24 HRS): SARS Coronavirus 2: NEGATIVE

## 2020-07-11 ENCOUNTER — Other Ambulatory Visit: Payer: Self-pay

## 2020-07-11 ENCOUNTER — Ambulatory Visit
Admission: RE | Admit: 2020-07-11 | Discharge: 2020-07-11 | Disposition: A | Discharge status: Home / Self Care | Payer: Medicare PPO | Attending: Gastroenterology | Admitting: Gastroenterology

## 2020-07-11 ENCOUNTER — Ambulatory Visit: Payer: Medicare PPO | Admitting: Anesthesiology

## 2020-07-11 ENCOUNTER — Encounter: Payer: Self-pay | Admitting: Gastroenterology

## 2020-07-11 ENCOUNTER — Encounter
Admission: RE | Disposition: A | Discharge status: Home / Self Care | Payer: Self-pay | Source: Home / Self Care | Attending: Gastroenterology

## 2020-07-11 DIAGNOSIS — K5730 Diverticulosis of large intestine without perforation or abscess without bleeding: Secondary | ICD-10-CM | POA: Insufficient documentation

## 2020-07-11 DIAGNOSIS — Z823 Family history of stroke: Secondary | ICD-10-CM | POA: Diagnosis not present

## 2020-07-11 DIAGNOSIS — K621 Rectal polyp: Secondary | ICD-10-CM | POA: Diagnosis not present

## 2020-07-11 DIAGNOSIS — Z8249 Family history of ischemic heart disease and other diseases of the circulatory system: Secondary | ICD-10-CM | POA: Diagnosis not present

## 2020-07-11 DIAGNOSIS — K635 Polyp of colon: Secondary | ICD-10-CM

## 2020-07-11 DIAGNOSIS — Z79899 Other long term (current) drug therapy: Secondary | ICD-10-CM | POA: Insufficient documentation

## 2020-07-11 DIAGNOSIS — D122 Benign neoplasm of ascending colon: Secondary | ICD-10-CM | POA: Diagnosis not present

## 2020-07-11 DIAGNOSIS — D120 Benign neoplasm of cecum: Secondary | ICD-10-CM | POA: Insufficient documentation

## 2020-07-11 DIAGNOSIS — D128 Benign neoplasm of rectum: Secondary | ICD-10-CM | POA: Diagnosis not present

## 2020-07-11 DIAGNOSIS — K644 Residual hemorrhoidal skin tags: Secondary | ICD-10-CM | POA: Insufficient documentation

## 2020-07-11 DIAGNOSIS — Z8711 Personal history of peptic ulcer disease: Secondary | ICD-10-CM | POA: Insufficient documentation

## 2020-07-11 DIAGNOSIS — Z881 Allergy status to other antibiotic agents status: Secondary | ICD-10-CM | POA: Insufficient documentation

## 2020-07-11 DIAGNOSIS — Z791 Long term (current) use of non-steroidal anti-inflammatories (NSAID): Secondary | ICD-10-CM | POA: Diagnosis not present

## 2020-07-11 DIAGNOSIS — Z1211 Encounter for screening for malignant neoplasm of colon: Secondary | ICD-10-CM | POA: Insufficient documentation

## 2020-07-11 DIAGNOSIS — Z96653 Presence of artificial knee joint, bilateral: Secondary | ICD-10-CM | POA: Insufficient documentation

## 2020-07-11 DIAGNOSIS — K2270 Barrett's esophagus without dysplasia: Secondary | ICD-10-CM | POA: Insufficient documentation

## 2020-07-11 HISTORY — PX: POLYPECTOMY: SHX5525

## 2020-07-11 HISTORY — DX: Dorsopathy, unspecified: M53.9

## 2020-07-11 HISTORY — PX: ESOPHAGOGASTRODUODENOSCOPY (EGD) WITH PROPOFOL: SHX5813

## 2020-07-11 HISTORY — PX: COLONOSCOPY WITH PROPOFOL: SHX5780

## 2020-07-11 SURGERY — COLONOSCOPY WITH PROPOFOL
Anesthesia: General

## 2020-07-11 MED ORDER — LACTATED RINGERS IV SOLN: INTRAVENOUS | Status: DC

## 2020-07-11 MED ORDER — LIDOCAINE HCL (CARDIAC) PF 100 MG/5ML IV SOSY
PREFILLED_SYRINGE | INTRAVENOUS | Status: DC | PRN
  Administered 2020-07-11: 50 mg via INTRAVENOUS

## 2020-07-11 MED ORDER — GLYCOPYRROLATE 0.2 MG/ML IJ SOLN
INTRAMUSCULAR | Status: DC | PRN
  Administered 2020-07-11: .1 mg via INTRAVENOUS

## 2020-07-11 MED ORDER — PROPOFOL 10 MG/ML IV BOLUS
INTRAVENOUS | Status: DC | PRN
  Administered 2020-07-11 (×2): 30 mg via INTRAVENOUS
  Administered 2020-07-11 (×6): 20 mg via INTRAVENOUS
  Administered 2020-07-11: 30 mg via INTRAVENOUS
  Administered 2020-07-11: 80 mg via INTRAVENOUS
  Administered 2020-07-11 (×2): 20 mg via INTRAVENOUS
  Administered 2020-07-11: 30 mg via INTRAVENOUS
  Administered 2020-07-11: 40 mg via INTRAVENOUS
  Administered 2020-07-11: 20 mg via INTRAVENOUS
  Administered 2020-07-11: 30 mg via INTRAVENOUS
  Administered 2020-07-11 (×2): 20 mg via INTRAVENOUS
  Administered 2020-07-11: 40 mg via INTRAVENOUS

## 2020-07-11 MED ORDER — STERILE WATER FOR IRRIGATION IR SOLN
Status: DC | PRN
  Administered 2020-07-11: 150 mL

## 2020-07-11 SURGICAL SUPPLY — 38 items
BALLN DILATOR 10-12 8 (BALLOONS)
BALLN DILATOR 12-15 8 (BALLOONS)
BALLN DILATOR 15-18 8 (BALLOONS)
BALLN DILATOR CRE 0-12 8 (BALLOONS)
BALLN DILATOR ESOPH 8 10 CRE (MISCELLANEOUS) IMPLANT
BALLOON DILATOR 12-15 8 (BALLOONS) IMPLANT
BALLOON DILATOR 15-18 8 (BALLOONS) IMPLANT
BALLOON DILATOR CRE 0-12 8 (BALLOONS) IMPLANT
BLOCK BITE 60FR ADLT L/F GRN (MISCELLANEOUS) ×3 IMPLANT
CLIP HMST 235XBRD CATH ROT (MISCELLANEOUS) IMPLANT
CLIP RESOLUTION 360 11X235 (MISCELLANEOUS)
ELECT REM PT RETURN 9FT ADLT (ELECTROSURGICAL)
ELECTRODE REM PT RTRN 9FT ADLT (ELECTROSURGICAL) IMPLANT
FCP ESCP3.2XJMB 240X2.8X (MISCELLANEOUS)
FORCEPS BIOP RAD 4 LRG CAP 4 (CUTTING FORCEPS) ×3 IMPLANT
FORCEPS BIOP RJ4 240 W/NDL (MISCELLANEOUS)
FORCEPS ESCP3.2XJMB 240X2.8X (MISCELLANEOUS) IMPLANT
GOWN CVR UNV OPN BCK APRN NK (MISCELLANEOUS) ×4 IMPLANT
GOWN ISOL THUMB LOOP REG UNIV (MISCELLANEOUS) ×6
INJECTOR VARIJECT VIN23 (MISCELLANEOUS) IMPLANT
KIT DEFENDO VALVE AND CONN (KITS) IMPLANT
KIT PRC NS LF DISP ENDO (KITS) ×2 IMPLANT
KIT PROCEDURE OLYMPUS (KITS) ×3
MANIFOLD NEPTUNE II (INSTRUMENTS) ×3 IMPLANT
MARKER SPOT ENDO TATTOO 5ML (MISCELLANEOUS) IMPLANT
PROBE APC STR FIRE (PROBE) IMPLANT
RETRIEVER NET PLAT FOOD (MISCELLANEOUS) IMPLANT
RETRIEVER NET ROTH 2.5X230 LF (MISCELLANEOUS) IMPLANT
SNARE COLD EXACTO (MISCELLANEOUS) ×3 IMPLANT
SNARE SHORT THROW 13M SML OVAL (MISCELLANEOUS) IMPLANT
SNARE SHORT THROW 30M LRG OVAL (MISCELLANEOUS) IMPLANT
SNARE SNG USE RND 15MM (INSTRUMENTS) IMPLANT
SPOT EX ENDOSCOPIC TATTOO (MISCELLANEOUS)
SYR INFLATION 60ML (SYRINGE) IMPLANT
TRAP ETRAP POLY (MISCELLANEOUS) ×3 IMPLANT
VARIJECT INJECTOR VIN23 (MISCELLANEOUS)
WATER STERILE IRR 250ML POUR (IV SOLUTION) ×3 IMPLANT
WIRE CRE 18-20MM 8CM F G (MISCELLANEOUS) IMPLANT

## 2020-07-11 NOTE — Transfer of Care (Signed)
Immediate Anesthesia Transfer of Care Note  Patient: Jeremiah Gates  Procedure(s) Performed: COLONOSCOPY WITH PROPOFOL (N/A ) ESOPHAGOGASTRODUODENOSCOPY (EGD) WITH PROPOFOL (N/A ) POLYPECTOMY  Patient Location: PACU  Anesthesia Type: General  Level of Consciousness: awake, alert  and patient cooperative  Airway and Oxygen Therapy: Patient Spontanous Breathing and Patient connected to supplemental oxygen  Post-op Assessment: Post-op Vital signs reviewed, Patient's Cardiovascular Status Stable, Respiratory Function Stable, Patent Airway and No signs of Nausea or vomiting  Post-op Vital Signs: Reviewed and stable  Complications: No complications documented.

## 2020-07-11 NOTE — Op Note (Signed)
[facility] Gastroenterology Patient Name: Jeremiah Gates Procedure Date: 07/11/2020 11:28 AM MRN: [id number] Account #: [id number] Date of Birth: [birth year] Admit Type: Outpatient Age: 70 Room: [facility] OR ROOM 01 Gender: Male Note Status: Finalized Procedure:             Upper GI endoscopy Indications:           Follow-up of acute gastric ulcer Providers:             Lin Landsman MD, MD Referring MD:          [facility] [place] (Referring MD) Medicines:             General Anesthesia Complications:         No immediate complications. Estimated blood loss: None. Procedure:             Pre-Anesthesia Assessment:                        - Prior to the procedure, a History and Physical was                         performed, and patient medications and allergies were                         reviewed. The patient is competent. The risks and                         benefits of the procedure and the sedation options and                         risks were discussed with the patient. All questions                         were answered and informed consent was obtained.                         Patient identification and proposed procedure were                         verified by the physician, the nurse, the                         anesthesiologist, the anesthetist and the technician                         in the pre-procedure area in the procedure room in the                         endoscopy suite. Mental Status Examination: alert and                         oriented. Airway Examination: normal oropharyngeal                         airway and neck mobility. Respiratory Examination:                         clear to auscultation. CV Examination: normal.  Prophylactic Antibiotics: The patient does not require                         prophylactic antibiotics. Prior Anticoagulants: The                         patient has taken no previous  anticoagulant or                         antiplatelet agents. ASA Grade Assessment: II - A                         patient with mild systemic disease. After reviewing                         the risks and benefits, the patient was deemed in                         satisfactory condition to undergo the procedure. The                         anesthesia plan was to use general anesthesia.                         Immediately prior to administration of medications,                         the patient was re-assessed for adequacy to receive                         sedatives. The heart rate, respiratory rate, oxygen                         saturations, blood pressure, adequacy of pulmonary                         ventilation, and response to care were monitored                         throughout the procedure. The physical status of the                         patient was re-assessed after the procedure.                        After obtaining informed consent, the endoscope was                         passed under direct vision. Throughout the procedure,                         the patient's blood pressure, pulse, and oxygen                         saturations were monitored continuously. The was                         introduced through the mouth, and advanced to the  second part of duodenum. The upper GI endoscopy was                         accomplished without difficulty. The patient tolerated                         the procedure well. Findings:      The duodenal bulb and second portion of the duodenum were normal.      The entire examined stomach was normal. Previous ulcers have healed      The cardia and gastric fundus were normal on retroflexion.      Two tongues of salmon-colored mucosa were present at 40 cm. No other       visible abnormalities were present. The maximum longitudinal extent of       these esophageal mucosal changes was 1 cm in length. Biopsies  were taken       with a cold forceps for histology. Impression:            - Normal duodenal bulb and second portion of the                         duodenum.                        - Normal stomach.                        - Salmon-colored mucosa suspicious for short-segment                         Barrett's esophagus. Biopsied. Recommendation:        - Await pathology results.                        - Continue present medications.                        - Follow an antireflux regimen. Procedure Code(s):     --- Professional ---                        [id number], Esophagogastroduodenoscopy, flexible,                         transoral; with biopsy, single or multiple Diagnosis Code(s):     --- Professional ---                        K22.8, Other specified diseases of esophagus                        K25.3, Acute gastric ulcer without hemorrhage or                         perforation CPT copyright 2019 American Medical Association. All rights reserved. The codes documented in this report are preliminary and upon coder review may  be revised to meet current compliance requirements. Dr. Libby Maw Toney Reil MD, MD 07/11/2020 11:49:33 AM This report has been signed electronically. Number of Addenda: 0 Note Initiated On: 07/11/2020 11:28 AM Estimated Blood Loss:  Estimated blood loss: none.      [facility]

## 2020-07-11 NOTE — Anesthesia Procedure Notes (Signed)
Performed by: Aritza Brunet, CRNA Pre-anesthesia Checklist: Patient identified, Emergency Drugs available, Suction available, Timeout performed and Patient being monitored Patient Re-evaluated:Patient Re-evaluated prior to induction Oxygen Delivery Method: Nasal cannula Placement Confirmation: positive ETCO2       

## 2020-07-11 NOTE — H&P (Signed)
Jeremiah Darby, MD [address]  [place]  [place], [place] [address]  Main: [phone]  Fax: [phone] Pager: [phone]  Primary Care Physician:  [facility] Primary Gastroenterologist:  Dr. Cephas Gates  Pre-Procedure History & Physical: HPI:  Jeremiah Gates is a 70 y.o. male is here for an endoscopy and colonoscopy.   Past Medical History:  Diagnosis Date  . Arthritis   . Bicuspid aortic valve   . Hyperlipidemia   . Hypertension   . Multilevel degenerative disc disease     Past Surgical History:  Procedure Laterality Date  . ESOPHAGOGASTRODUODENOSCOPY (EGD) WITH PROPOFOL N/A 04/10/2020   Procedure: ESOPHAGOGASTRODUODENOSCOPY (EGD) WITH PROPOFOL;  Surgeon: Lin Landsman, MD;  Location: [facility];  Service: Gastroenterology;  Laterality: N/A;  . JOINT REPLACEMENT    . tkrx2 Bilateral   . TONSILLECTOMY    . varicle      Prior to Admission medications   Medication Sig Start Date End Date Taking? Authorizing Provider  amLODipine (NORVASC) 5 MG tablet Take 5 mg by mouth daily. 02/27/20   [provider]  ASPIRIN LOW DOSE 81 MG EC tablet Take 81 mg by mouth daily. 02/21/20   [provider]  Biotin 2.5 MG TABS biotin 2,500 mcg tablet  Take two 2500 mcg caps daily = 5000 mcg    [provider]  Chromium Picolinate 400 MCG TABS chromium picolinate 400 mcg tablet    [provider]  cyclobenzaprine (FLEXERIL) 5 MG tablet cyclobenzaprine 5 mg tablet    [provider]  fluticasone (FLONASE) 50 MCG/ACT nasal spray fluticasone propionate 50 mcg/actuation nasal spray,suspension    [provider]  hydrochlorothiazide (HYDRODIURIL) 25 MG tablet Take 25 mg by mouth daily.    [provider]  lisinopril (ZESTRIL) 40 MG tablet Take 40 mg by mouth daily. 03/02/20   [provider]  lovastatin (MEVACOR) 20 MG tablet Take 20 mg by mouth daily. 01/30/20   [provider]  meloxicam (MOBIC) 15 MG tablet Take 1 tablet (15 mg total) by mouth daily. Hold for two weeks 04/10/20   Florencia Reasons, MD  Multiple Vitamin (MULTIVITAMIN) capsule Take 1 capsule by mouth daily.    [provider]  Omega-3 Fatty Acids (FISH OIL) 1000 MG CAPS Take by mouth.    [provider]  omeprazole (PRILOSEC) 40 MG capsule TAKE 1 CAPSULE BY MOUTH TWICE A DAY 07/04/20   Eola Waldrep, Tally Due, MD  tiZANidine (ZANAFLEX) 4 MG capsule  02/05/20   [provider]  traMADol (ULTRAM) 50 MG tablet tramadol 50 mg tablet    [provider]    Allergies as of 06/24/2020 - Review Complete 06/24/2020  Allergen Reaction Noted  . Ciprofloxacin Other (See Comments) 03/31/2019    Family History  Problem Relation Age of Onset  . Heart failure Father   . Stroke Father     Social History   Socioeconomic History  . Marital status: Married    Spouse name: Not on file  . Number of children: Not on file  . Years of education: Not on file  . Highest education level: Not on file  Occupational History  . Not on file  Tobacco Use  . Smoking status: Never Smoker  . Smokeless tobacco: Never Used  Vaping Use  . Vaping Use: Never used  Substance and Sexual Activity  . Alcohol use: Yes    Comment: socially (none since 10/21)  . Drug use: No  .  Sexual activity: Not on file  Other Topics Concern  . Not on file  Social History Narrative  . Not on file   Social Determinants of Health   Financial Resource Strain: Not on file  Food Insecurity: Not on file  Transportation Needs: Not on file  Physical Activity: Not on file  Stress: Not on file  Social Connections: Not on file  Intimate Partner Violence: Not on file    Review of Systems: See HPI, otherwise negative ROS  Physical Exam: BP (!) 152/86   Pulse 90   Temp (!) 96.9 F (36.1 C) (Temporal)   Resp 16   Ht 5\' 10"  (1.778 m)   Wt 89.4 kg   SpO2 97%   BMI 28.27 kg/m  General:   Alert,   pleasant and cooperative in NAD Head:  Normocephalic and atraumatic. Neck:  Supple; no masses or thyromegaly. Lungs:  Clear throughout to auscultation.    Heart:  Regular rate and rhythm. Abdomen:  Soft, nontender and nondistended. Normal bowel sounds, without guarding, and without rebound.   Neurologic:  Alert and  oriented x4;  grossly normal neurologically.  Impression/Plan: Jeremiah Gates is here for an endoscopy and colonoscopy to be performed for h/o gastric ulcers, colon cancer screening  Risks, benefits, limitations, and alternatives regarding  endoscopy and colonoscopy have been reviewed with the patient.  Questions have been answered.  All parties agreeable.   Sherri Sear, MD  07/11/2020, 11:27 AM

## 2020-07-11 NOTE — Op Note (Signed)
[facility] Patient Name: Jeremiah Gates Procedure Date: 07/11/2020 11:27 AM MRN: [id number] Account #: [id number] Date of Birth: 1951/03/22 Admit Type: Outpatient Age: 70 Room: [facility] OR ROOM 01 Gender: Male Note Status: Finalized Procedure:             Colonoscopy Indications:           Screening for colorectal malignant neoplasm, This is                         the patient's first colonoscopy Providers:             Lin Landsman MD, MD Referring MD:          [facility] (Referring MD) Medicines:             General Anesthesia Complications:         No immediate complications. Estimated blood loss:                         Minimal. Procedure:             Pre-Anesthesia Assessment:                        - Prior to the procedure, a History and Physical was                         performed, and patient medications and allergies were                         reviewed. The patient is competent. The risks and                         benefits of the procedure and the sedation options and                         risks were discussed with the patient. All questions                         were answered and informed consent was obtained.                         Patient identification and proposed procedure were                         verified by the physician, the nurse, the                         anesthesiologist, the anesthetist and the technician                         in the pre-procedure area in the procedure room in the                         endoscopy suite. Mental Status Examination: alert and                         oriented. Airway Examination: normal oropharyngeal  airway and neck mobility. Respiratory Examination:                         clear to auscultation. CV Examination: normal.                         Prophylactic Antibiotics: The patient does not require                         prophylactic  antibiotics. Prior Anticoagulants: The                         patient has taken no previous anticoagulant or                         antiplatelet agents. ASA Grade Assessment: II - A                         patient with mild systemic disease. After reviewing                         the risks and benefits, the patient was deemed in                         satisfactory condition to undergo the procedure. The                         anesthesia plan was to use general anesthesia.                         Immediately prior to administration of medications,                         the patient was re-assessed for adequacy to receive                         sedatives. The heart rate, respiratory rate, oxygen                         saturations, blood pressure, adequacy of pulmonary                         ventilation, and response to care were monitored                         throughout the procedure. The physical status of the                         patient was re-assessed after the procedure.                        After obtaining informed consent, the colonoscope was                         passed under direct vision. Throughout the procedure,                         the patient's blood pressure, pulse, and oxygen  saturations were monitored continuously. The was                         introduced through the anus and advanced to the the                         cecum, identified by appendiceal orifice and ileocecal                         valve. The colonoscopy was performed without                         difficulty. The patient tolerated the procedure well.                         The quality of the bowel preparation was evaluated                         using the BBPS [facility] Bowel Preparation Scale) with                         scores of: Right Colon = 3, Transverse Colon = 3 and                         Left Colon = 3 (entire mucosa seen well with no                          residual staining, small fragments of stool or opaque                         liquid). The total BBPS score equals 9. Findings:      The perianal and digital rectal examinations were normal. Pertinent       negatives include normal sphincter tone and no palpable rectal lesions.      Five sessile polyps were found in the rectum, ascending colon and cecum.       The polyps were 3 to 5 mm in size. These polyps were removed with a cold       snare. Resection and retrieval were complete.      Multiple diverticula were found in the sigmoid colon.      Non-bleeding external hemorrhoids were found during retroflexion. The       hemorrhoids were large.      The exam was otherwise without abnormality. Impression:            - Five 3 to 5 mm polyps in the rectum, in the                         ascending colon and in the cecum, removed with a cold                         snare. Resected and retrieved.                        - Diverticulosis in the sigmoid colon.                        - Non-bleeding external hemorrhoids.                        -  The examination was otherwise normal. Recommendation:        - Discharge patient to home (with escort).                        - Resume previous diet today.                        - Continue present medications.                        - Await pathology results.                        - Repeat colonoscopy in 5 years for surveillance of                         multiple polyps. Procedure Code(s):     --- Professional ---                        [phone], Colonoscopy, flexible; with removal of                         tumor(s), polyp(s), or other lesion(s) by snare                         technique Diagnosis Code(s):     --- Professional ---                        Z12.11, Encounter for screening for malignant neoplasm                         of colon                        K62.1, Rectal polyp                        K63.5, Polyp of colon                         K64.4, Residual hemorrhoidal skin tags                        K57.30, Diverticulosis of large intestine without                         perforation or abscess without bleeding CPT copyright 2019 American Medical Association. All rights reserved. The codes documented in this report are preliminary and upon coder review may  be revised to meet current compliance requirements. Dr. Ulyess Mort Lin Landsman MD, MD 07/11/2020 12:14:39 PM This report has been signed electronically. Number of Addenda: 0 Note Initiated On: 07/11/2020 11:27 AM Scope Withdrawal Time: 0 hours 16 minutes 47 seconds  Total Procedure Duration: 0 hours 20 minutes 11 seconds  Estimated Blood Loss:  Estimated blood loss was minimal.      [facility]

## 2020-07-11 NOTE — Anesthesia Postprocedure Evaluation (Signed)
Anesthesia Post Note  Patient: Jeremiah Gates  Procedure(s) Performed: COLONOSCOPY WITH PROPOFOL (N/A ) ESOPHAGOGASTRODUODENOSCOPY (EGD) WITH PROPOFOL (N/A ) POLYPECTOMY     Patient location during evaluation: PACU Anesthesia Type: General Level of consciousness: awake and alert Pain management: pain level controlled Vital Signs Assessment: post-procedure vital signs reviewed and stable Respiratory status: spontaneous breathing, nonlabored ventilation, respiratory function stable and patient connected to nasal cannula oxygen Cardiovascular status: blood pressure returned to baseline and stable Postop Assessment: no apparent nausea or vomiting Anesthetic complications: no   No complications documented.  Velton [place], [facility]

## 2020-07-12 ENCOUNTER — Encounter: Payer: Self-pay | Admitting: Gastroenterology

## 2020-07-12 LAB — SURGICAL PATHOLOGY

## 2020-09-11 DIAGNOSIS — D170 Benign lipomatous neoplasm of skin and subcutaneous tissue of head, face and neck: Secondary | ICD-10-CM | POA: Insufficient documentation

## 2020-10-04 ENCOUNTER — Telehealth: Payer: Self-pay | Admitting: Gastroenterology

## 2020-10-04 MED ORDER — OMEPRAZOLE 40 MG PO CPDR: 1.0000 | DELAYED_RELEASE_CAPSULE | Freq: Two times a day (BID) | ORAL | 0 refills | Status: DC

## 2020-10-04 NOTE — Telephone Encounter (Signed)
omeprazole (PRILOSEC) 40 MG capsule  90 days [facility] [place]  Patient has f/u appt 12/27/20

## 2020-10-04 NOTE — Telephone Encounter (Signed)
Sent medication to pharmacy.   

## 2020-12-26 ENCOUNTER — Other Ambulatory Visit: Payer: Self-pay

## 2020-12-27 ENCOUNTER — Ambulatory Visit: Payer: Medicare PPO | Admitting: Gastroenterology

## 2020-12-27 ENCOUNTER — Encounter: Payer: Self-pay | Admitting: Gastroenterology

## 2020-12-27 ENCOUNTER — Other Ambulatory Visit: Payer: Self-pay

## 2020-12-27 VITALS — BP 128/73 | HR 88 | Temp 97.9°F | Ht 70.0 in | Wt 204.2 lb

## 2020-12-27 DIAGNOSIS — K2270 Barrett's esophagus without dysplasia: Secondary | ICD-10-CM | POA: Diagnosis not present

## 2020-12-27 DIAGNOSIS — Z79899 Other long term (current) drug therapy: Secondary | ICD-10-CM | POA: Diagnosis not present

## 2020-12-27 NOTE — Progress Notes (Signed)
Jeremiah Darby, MD [address]  [place]  [place], [place] [address]  Main: [phone]  Fax: [phone]    Gastroenterology Consultation  Referring Provider:     [facility] Primary Care Physician:  [facility] Primary Gastroenterologist:  Dr. Cephas Gates Reason for Consultation:     History of melena, gastric ulcers        HPI:   Jeremiah Gates is a 70 y.o. male referred by Dr. Shari Prows, [facility]  for consultation & management of history of melena, insulin use gastric ulcer.  Patient was admitted to [facility] on 10/26 secondary to melena, mild anemia in setting of chronic NSAID use.  He underwent upper endoscopy which confirmed presence of several small nonbleeding gastric ulcers which were thought to be secondary to chronic NSAID use.  There was no evidence of H. pylori.  He was discharged home on omeprazole 40 mg twice daily and was recommended to avoid meloxicam which has been taking for chronic shoulder pain.  Patient is no longer experiencing melena.  He continues to take omeprazole 40 mg twice daily.  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  He is now taking meloxicam every other day which helps to regulate his pain significantly.  He has appointment to see orthopedic for his shoulder pain.  He denies any recurrent episodes of melena.  He denies epigastric pain.  Follow-up visit 12/27/2020 Patient is here for follow-up of peptic ulcer disease.  He is doing remarkably well.  His repeat EGD revealed complete healing of gastric ulcer.  He is currently taking omeprazole 40 mg daily along with meloxicam.  He does not have any GI concerns.  NSAIDs: Meloxicam  Antiplts/Anticoagulants/Anti thrombotics: None  GI Procedures:  EGD and colonoscopy 07/11/2020 - Normal duodenal bulb and second portion of the duodenum. - Normal stomach. -  Salmon-colored mucosa suspicious for short-segment Barrett's esophagus. Biopsied.  - Five 3 to 5 mm polyps in the rectum, in the ascending colon and in the cecum, removed with a cold snare. Resected and retrieved. - Diverticulosis in the sigmoid colon. - Non-bleeding external hemorrhoids. - The examination was otherwise normal.  DIAGNOSIS:  A. GEJ; COLD BIOPSY:  - BARRETT'S ESOPHAGUS.  - NEGATIVE FOR DYSPLASIA AND MALIGNANCY.   B. COLON POLYP X2, CECUM; COLD SNARE:  - TUBULAR ADENOMA, MULTIPLE FRAGMENTS.  - NEGATIVE FOR HIGH-GRADE DYSPLASIA AND MALIGNANCY.   C. COLON POLYP X2, ASCENDING; COLD SNARE:  - TUBULAR ADENOMA, MULTIPLE FRAGMENTS.  - NEGATIVE FOR HIGH-GRADE DYSPLASIA AND MALIGNANCY.   D. RECTUM POLYP; COLD SNARE:  - TUBULAR ADENOMA, MULTIPLE FRAGMENTS.  - NEGATIVE FOR HIGH-GRADE DYSPLASIA AND MALIGNANCY.   Upper endoscopy 04/10/2020 - Normal duodenal bulb and second portion of the duodenum. - Non-bleeding gastric ulcers with a clean ulcer base (Forrest Class III). Biopsied. - Normal gastric body. Biopsied. - Z-line irregular, 40 cm from the incisors. - Normal gastroesophageal junction and esophagus. DIAGNOSIS:  A. STOMACH, RANDOM; COLD BIOPSY:  - GASTRIC ANTRAL AND OXYNTIC MUCOSA WITH FEATURES OF REACTIVE  GASTROPATHY.  - NEGATIVE FOR H. PYLORI, DYSPLASIA, AND MALIGNANCY.   Past Medical History:  Diagnosis Date   Arthritis    Bicuspid aortic valve    Hyperlipidemia    Hypertension    Multilevel degenerative disc disease     Past Surgical History:  Procedure Laterality Date   COLONOSCOPY WITH  PROPOFOL N/A 07/11/2020   Procedure: COLONOSCOPY WITH PROPOFOL;  Surgeon: Lin Landsman, MD;  Location: [place];  Service: Gastroenterology;  Laterality: N/A;   ESOPHAGOGASTRODUODENOSCOPY (EGD) WITH PROPOFOL N/A 04/10/2020   Procedure: ESOPHAGOGASTRODUODENOSCOPY (EGD) WITH PROPOFOL;  Surgeon: Lin Landsman, MD;  Location: [facility] ENDOSCOPY;  Service:  Gastroenterology;  Laterality: N/A;   ESOPHAGOGASTRODUODENOSCOPY (EGD) WITH PROPOFOL N/A 07/11/2020   Procedure: ESOPHAGOGASTRODUODENOSCOPY (EGD) WITH PROPOFOL;  Surgeon: Lin Landsman, MD;  Location: [place];  Service: Gastroenterology;  Laterality: N/A;   JOINT REPLACEMENT     POLYPECTOMY  07/11/2020   Procedure: POLYPECTOMY;  Surgeon: Lin Landsman, MD;  Location: [place];  Service: Gastroenterology;;   tkrx2 Bilateral    TONSILLECTOMY     varicle      Current Outpatient Medications:    amLODipine (NORVASC) 5 MG tablet, Take 5 mg by mouth daily., Disp: , Rfl:    ASPIRIN LOW DOSE 81 MG EC tablet, Take 81 mg by mouth daily., Disp: , Rfl:    Biotin 2.5 MG TABS, biotin 2,500 mcg tablet  Take two 2500 mcg caps daily = 5000 mcg, Disp: , Rfl:    Chromium Picolinate 400 MCG TABS, chromium picolinate 400 mcg tablet, Disp: , Rfl:    fluticasone (FLONASE) 50 MCG/ACT nasal spray, fluticasone propionate 50 mcg/actuation nasal spray,suspension, Disp: , Rfl:    hydrochlorothiazide (HYDRODIURIL) 25 MG tablet, Take 25 mg by mouth daily., Disp: , Rfl:    lisinopril (ZESTRIL) 40 MG tablet, Take 40 mg by mouth daily., Disp: , Rfl:    lovastatin (MEVACOR) 20 MG tablet, Take by mouth., Disp: , Rfl:    meloxicam (MOBIC) 15 MG tablet, Take 1 tablet (15 mg total) by mouth daily. Hold for two weeks, Disp: , Rfl:    Multiple Vitamin (MULTIVITAMIN) capsule, Take 1 capsule by mouth daily., Disp: , Rfl:    Omega-3 Fatty Acids (FISH OIL) 1000 MG CAPS, Take by mouth., Disp: , Rfl:    omeprazole (PRILOSEC) 40 MG capsule, Take by mouth., Disp: , Rfl:   Family History  Problem Relation Age of Onset   Heart failure Father    Stroke Father      Social History   Tobacco Use   Smoking status: Never   Smokeless tobacco: Never  Vaping Use   Vaping Use: Never used  Substance Use Topics   Alcohol use: Yes    Comment: socially (none since 10/21)   Drug use: No    Allergies as of  12/27/2020 - Review Complete 12/27/2020  Allergen Reaction Noted   Ciprofloxacin Other (See Comments) 03/31/2019    Review of Systems:    All systems reviewed and negative except where noted in HPI.   Physical Exam:  BP 128/73 (BP Location: Left Arm, Patient Position: Sitting, Cuff Size: Normal)   Pulse 88   Temp 97.9 F (36.6 C) (Oral)   Ht 5\' 10"  (1.778 m)   Wt 204 lb 4 oz (92.6 kg)   BMI 29.31 kg/m  No LMP for male patient.  General:   Alert,  Well-developed, well-nourished, pleasant and cooperative in NAD Head:  Normocephalic and atraumatic. Eyes:  Sclera clear, no icterus.   Conjunctiva pink. Ears:  Normal auditory acuity. Nose:  No deformity, discharge, or lesions. Mouth:  No deformity or lesions,oropharynx pink & moist. Neck:  Supple; no masses or thyromegaly. Lungs:  Respirations even and unlabored.  Clear throughout to auscultation.   No wheezes, crackles, or rhonchi. No acute distress.  Heart:  Regular rate and rhythm; no murmurs, clicks, rubs, or gallops. Abdomen:  Normal bowel sounds. Soft, non-tender and non-distended without masses, hepatosplenomegaly or hernias noted.  No guarding or rebound tenderness.   Rectal: Not performed Msk:  Symmetrical without gross deformities. Good, equal movement & strength bilaterally. Pulses:  Normal pulses noted. Extremities:  No clubbing or edema.  No cyanosis. Neurologic:  Alert and oriented x3;  grossly normal neurologically. Skin:  Intact without significant lesions or rashes. No jaundice. Psych:  Alert and cooperative. Normal mood and affect.  Imaging Studies: No abdominal imaging  Assessment and Plan:   Jeremiah Gates is a 70 y.o. pleasant Caucasian male with history of arthritis, hypertension, known history of NSAID induced gastric ulcers s/p confirmed healing on repeat EGD  Short segment Barrett's esophagus with no evidence of dysplasia Continue long-term omeprazole 40 mg daily before meals  Check serum  creatinine, vitamin B-12 and vitamin D levels  Tubular adenomas of the colon Recommend surveillance colonoscopy in 2025   Follow up annually   Jeremiah Darby, MD

## 2021-01-01 ENCOUNTER — Encounter: Payer: Self-pay | Admitting: Gastroenterology

## 2021-01-01 LAB — VITAMIN D 25 HYDROXY (VIT D DEFICIENCY, FRACTURES): Vit D, 25-Hydroxy: 31.6 ng/mL (ref 30.0–100.0)

## 2021-01-01 LAB — B12 AND FOLATE PANEL
Folate: 8.1 ng/mL (ref >3.0)
Vitamin B-12: 444 pg/mL (ref 232–1245)

## 2021-01-01 LAB — CREATINE: Creatine, Serum: 0.5 mg/dL (ref 0.0–0.7)

## 2021-01-04 ENCOUNTER — Other Ambulatory Visit: Payer: Self-pay | Admitting: Gastroenterology

## 2021-05-27 ENCOUNTER — Other Ambulatory Visit: Payer: Self-pay | Admitting: Gastroenterology

## 2021-05-27 MED ORDER — OMEPRAZOLE 40 MG PO CPDR: 40.0000 mg | DELAYED_RELEASE_CAPSULE | Freq: Two times a day (BID) | ORAL | 0 refills | Status: DC

## 2021-05-28 DIAGNOSIS — K4090 Unilateral inguinal hernia, without obstruction or gangrene, not specified as recurrent: Secondary | ICD-10-CM | POA: Insufficient documentation

## 2021-08-23 ENCOUNTER — Other Ambulatory Visit: Payer: Self-pay | Admitting: Gastroenterology

## 2022-01-05 ENCOUNTER — Ambulatory Visit: Payer: Medicare PPO | Admitting: Gastroenterology

## 2022-01-05 ENCOUNTER — Other Ambulatory Visit: Payer: Self-pay

## 2022-01-05 ENCOUNTER — Encounter: Payer: Self-pay | Admitting: Gastroenterology

## 2022-01-05 VITALS — BP 148/82 | HR 83 | Temp 98.3°F | Ht 70.0 in | Wt 206.1 lb

## 2022-01-05 DIAGNOSIS — K2270 Barrett's esophagus without dysplasia: Secondary | ICD-10-CM | POA: Diagnosis not present

## 2022-01-05 DIAGNOSIS — M1991 Primary osteoarthritis, unspecified site: Secondary | ICD-10-CM | POA: Insufficient documentation

## 2022-01-05 NOTE — Progress Notes (Signed)
Cephas Darby, MD [address]  [place]  [place], [place] [address]  Main: [phone]  Fax: [phone]    Gastroenterology Consultation  Referring Provider:     [facility] Primary Care Physician:  [facility] Primary Gastroenterologist:  Dr. Cephas Darby Reason for Consultation: Barrett's esophagus        HPI:   Jeremiah Gates is a 71 y.o. male referred by Dr. Shari Prows, [facility]  for consultation & management of history of melena, insulin use gastric ulcer.  Patient was admitted to [facility] on 10/26 secondary to melena, mild anemia in setting of chronic NSAID use.  He underwent upper endoscopy which confirmed presence of several small nonbleeding gastric ulcers which were thought to be secondary to chronic NSAID use.  There was no evidence of H. pylori.  He was discharged home on omeprazole 40 mg twice daily and was recommended to avoid meloxicam which has been taking for chronic shoulder pain.  Patient is no longer experiencing melena.  He continues to take omeprazole 40 mg twice daily.  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  He is now taking meloxicam every other day which helps to regulate his pain significantly.  He has appointment to see orthopedic for his shoulder pain.  He denies any recurrent episodes of melena.  He denies epigastric pain.  Follow-up visit 12/27/2020 Patient is here for follow-up of peptic ulcer disease.  He is doing remarkably well.  His repeat EGD revealed complete healing of gastric ulcer.  He is currently taking omeprazole 40 mg daily along with meloxicam.  He does not have any GI concerns.  Follow-up visit 01/05/2022 Patient is here for annual follow-up of Barrett's esophagus.  He has short segment Barrett's esophagus without evidence of dysplasia based on last upper endoscopy in 2022.  Patient is currently  taking long-term omeprazole 40 mg 1-2 times daily.  He does not have any GI concerns today.  NSAIDs: Meloxicam  Antiplts/Anticoagulants/Anti thrombotics: None  GI Procedures:  EGD and colonoscopy 07/11/2020 - Normal duodenal bulb and second portion of the duodenum. - Normal stomach. - Salmon-colored mucosa suspicious for short-segment Barrett's esophagus. Biopsied.  - Five 3 to 5 mm polyps in the rectum, in the ascending colon and in the cecum, removed with a cold snare. Resected and retrieved. - Diverticulosis in the sigmoid colon. - Non-bleeding external hemorrhoids. - The examination was otherwise normal.  DIAGNOSIS:  A. GEJ; COLD BIOPSY:  - BARRETT'S ESOPHAGUS.  - NEGATIVE FOR DYSPLASIA AND MALIGNANCY.   B. COLON POLYP X2, CECUM; COLD SNARE:  - TUBULAR ADENOMA, MULTIPLE FRAGMENTS.  - NEGATIVE FOR HIGH-GRADE DYSPLASIA AND MALIGNANCY.   C. COLON POLYP X2, ASCENDING; COLD SNARE:  - TUBULAR ADENOMA, MULTIPLE FRAGMENTS.  - NEGATIVE FOR HIGH-GRADE DYSPLASIA AND MALIGNANCY.   D. RECTUM POLYP; COLD SNARE:  - TUBULAR ADENOMA, MULTIPLE FRAGMENTS.  - NEGATIVE FOR HIGH-GRADE DYSPLASIA AND MALIGNANCY.   Upper endoscopy 04/10/2020 - Normal duodenal bulb and second portion of the duodenum. - Non-bleeding gastric ulcers with a clean ulcer base (Forrest Class III). Biopsied. - Normal gastric body. Biopsied. - Z-line irregular, 40 cm from the incisors. - Normal gastroesophageal junction and esophagus. DIAGNOSIS:  A. STOMACH, RANDOM; COLD BIOPSY:  - GASTRIC ANTRAL AND OXYNTIC MUCOSA WITH FEATURES OF REACTIVE  GASTROPATHY.  - NEGATIVE FOR H. PYLORI, DYSPLASIA, AND MALIGNANCY.  Past Medical History:  Diagnosis Date   Arthritis    Bicuspid aortic valve    Hyperlipidemia    Hypertension    Multilevel degenerative disc disease     Past Surgical History:  Procedure Laterality Date   COLONOSCOPY WITH PROPOFOL N/A 07/11/2020   Procedure: COLONOSCOPY WITH PROPOFOL;  Surgeon: Lin Landsman, MD;  Location: [place];  Service: Gastroenterology;  Laterality: N/A;   ESOPHAGOGASTRODUODENOSCOPY (EGD) WITH PROPOFOL N/A 04/10/2020   Procedure: ESOPHAGOGASTRODUODENOSCOPY (EGD) WITH PROPOFOL;  Surgeon: Lin Landsman, MD;  Location: [facility]COPY;  Service: Gastroenterology;  Laterality: N/A;   ESOPHAGOGASTRODUODENOSCOPY (EGD) WITH PROPOFOL N/A 07/11/2020   Procedure: ESOPHAGOGASTRODUODENOSCOPY (EGD) WITH PROPOFOL;  Surgeon: Lin Landsman, MD;  Location: [place];  Service: Gastroenterology;  Laterality: N/A;   JOINT REPLACEMENT     POLYPECTOMY  07/11/2020   Procedure: POLYPECTOMY;  Surgeon: Lin Landsman, MD;  Location: [place];  Service: Gastroenterology;;   tkrx2 Bilateral    TONSILLECTOMY     varicle      Current Outpatient Medications:    amLODipine (NORVASC) 5 MG tablet, Take 1 tablet by mouth daily., Disp: , Rfl:    aspirin EC 81 MG tablet, Take 1 tablet by mouth daily., Disp: , Rfl:    azelastine (OPTIVAR) 0.05 % ophthalmic solution, azelastine 0.05 % eye drops  PLACE 1 DROP INTO BOTH EYES 2 TIMES DAILY AS NEEDED FOR ALLERGIES., Disp: , Rfl:    Biotin 2.5 MG TABS, biotin 2,500 mcg tablet  Take two 2500 mcg caps daily = 5000 mcg, Disp: , Rfl:    Chromium Picolinate 400 MCG TABS, chromium picolinate 400 mcg tablet, Disp: , Rfl:    cyclobenzaprine (FLEXERIL) 10 MG tablet, Take 10 mg by mouth 3 (three) times daily as needed., Disp: , Rfl:    fluticasone (FLONASE) 50 MCG/ACT nasal spray, fluticasone propionate 50 mcg/actuation nasal spray,suspension, Disp: , Rfl:    hydrochlorothiazide (HYDRODIURIL) 25 MG tablet, Take 25 mg by mouth daily., Disp: , Rfl:    lisinopril (ZESTRIL) 40 MG tablet, Take 1 tablet by mouth daily., Disp: , Rfl:    lovastatin (MEVACOR) 20 MG tablet, Take by mouth., Disp: , Rfl:    meloxicam (MOBIC) 15 MG tablet, Take 1 tablet by mouth daily., Disp: , Rfl:    Multiple Vitamin (MULTIVITAMIN) capsule,  Take 1 capsule by mouth daily., Disp: , Rfl:    Omega-3 Fatty Acids (FISH OIL) 1000 MG CAPS, Take by mouth., Disp: , Rfl:    omeprazole (PRILOSEC) 40 MG capsule, TAKE 1 CAPSULE BY MOUTH TWICE A DAY, Disp: 180 capsule, Rfl: 1   tiZANidine (ZANAFLEX) 4 MG tablet, , Disp: , Rfl:    traMADol (ULTRAM) 50 MG tablet, Take 50 mg by mouth every 6 (six) hours as needed., Disp: , Rfl:   Family History  Problem Relation Age of Onset   Heart failure Father    Stroke Father      Social History   Tobacco Use   Smoking status: Never   Smokeless tobacco: Never  Vaping Use   Vaping Use: Never used  Substance Use Topics   Alcohol use: Yes    Comment: socially (none since 10/21)   Drug use: No    Allergies as of 01/05/2022 - Review Complete 01/05/2022  Allergen Reaction Noted   Ciprofloxacin Other (See Comments) 03/31/2019    Review of Systems:    All systems reviewed and negative except where noted in HPI.   Physical Exam:  BP (!) 148/82 (BP Location: Left Arm, Patient Position: Sitting, Cuff Size: Normal)   Pulse 83   Temp 98.3 F (36.8 C) (Oral)   Ht '5\' 10"'$  (1.778 m)   Wt 206 lb 2 oz (93.5 kg)   BMI 29.58 kg/m  No LMP for male patient.  General:   Alert,  Well-developed, well-nourished, pleasant and cooperative in NAD Head:  Normocephalic and atraumatic. Eyes:  Sclera clear, no icterus.   Conjunctiva pink. Ears:  Normal auditory acuity. Nose:  No deformity, discharge, or lesions. Mouth:  No deformity or lesions,oropharynx pink & moist. Neck:  Supple; no masses or thyromegaly. Lungs:  Respirations even and unlabored.  Clear throughout to auscultation.   No wheezes, crackles, or rhonchi. No acute distress. Heart:  Regular rate and rhythm; no murmurs, clicks, rubs, or gallops. Abdomen:  Normal bowel sounds. Soft, non-tender and non-distended without masses, hepatosplenomegaly or hernias noted.  No guarding or rebound tenderness.   Rectal: Not performed Msk:  Symmetrical without  gross deformities. Good, equal movement & strength bilaterally. Pulses:  Normal pulses noted. Extremities:  No clubbing or edema.  No cyanosis. Neurologic:  Alert and oriented x3;  grossly normal neurologically. Skin:  Intact without significant lesions or rashes. No jaundice. Psych:  Alert and cooperative. Normal mood and affect.  Imaging Studies: No abdominal imaging  Assessment and Plan:   Jeremiah Gates is a 71 y.o. pleasant Caucasian male with history of arthritis, hypertension, known history of NSAID induced gastric ulcers s/p confirmed healing on repeat EGD  Short segment Barrett's esophagus with no evidence of dysplasia Continue long-term omeprazole 40 mg daily before meals  Continue over-the-counter vitamin B-12 and vitamin D levels Recommend surveillance upper endoscopy in 2025  Tubular adenomas of the colon Recommend surveillance colonoscopy in 2025   Follow up as needed   Cephas Darby, MD

## 2022-02-17 ENCOUNTER — Other Ambulatory Visit: Payer: Self-pay | Admitting: Gastroenterology

## 2022-08-18 ENCOUNTER — Other Ambulatory Visit: Payer: Self-pay | Admitting: Gastroenterology

## 2023-02-11 ENCOUNTER — Other Ambulatory Visit: Payer: Self-pay | Admitting: Gastroenterology

## 2023-03-24 ENCOUNTER — Other Ambulatory Visit: Payer: Self-pay

## 2023-03-30 ENCOUNTER — Ambulatory Visit: Payer: Medicare PPO | Admitting: Gastroenterology

## 2023-03-30 ENCOUNTER — Other Ambulatory Visit: Payer: Self-pay

## 2023-03-30 ENCOUNTER — Encounter: Payer: Self-pay | Admitting: Gastroenterology

## 2023-03-30 VITALS — BP 108/67 | HR 83 | Temp 97.8°F | Ht 70.0 in | Wt 192.0 lb

## 2023-03-30 DIAGNOSIS — Z860101 Personal history of adenomatous and serrated colon polyps: Secondary | ICD-10-CM

## 2023-03-30 DIAGNOSIS — K2270 Barrett's esophagus without dysplasia: Secondary | ICD-10-CM | POA: Diagnosis not present

## 2023-03-30 DIAGNOSIS — Z860100 Personal history of colon polyps, unspecified: Secondary | ICD-10-CM

## 2023-03-30 MED ORDER — NA SULFATE-K SULFATE-MG SULF 17.5-3.13-1.6 GM/177ML PO SOLN: 354.0000 mL | Freq: Once | ORAL | 0 refills | Status: AC

## 2023-03-30 MED ORDER — OMEPRAZOLE 40 MG PO CPDR: 40.0000 mg | DELAYED_RELEASE_CAPSULE | Freq: Two times a day (BID) | ORAL | 0 refills | Status: DC

## 2023-03-30 NOTE — Progress Notes (Signed)
Arlyss Repress, MD [address]  [address]  [place], [place] [address]  Main: [phone]  Fax: [phone]    Gastroenterology Consultation  Referring Provider:     Jerrilyn Cairo Primary Ca* Primary Care Physician:  Ardyth Man, PA-C Primary Gastroenterologist:  Dr. Arlyss Repress Reason for Consultation: Barrett's esophagus        HPI:   Jeremiah Gates is a 72 y.o. male referred by Dr. Ardyth Man, PA-C  for consultation & management of history of melena, insulin use gastric ulcer.  Patient was admitted to [facility] on 10/26 secondary to melena, mild anemia in setting of chronic NSAID use.  He underwent upper endoscopy which confirmed presence of several small nonbleeding gastric ulcers which were thought to be secondary to chronic NSAID use.  There was no evidence of H. pylori.  He was discharged home on omeprazole 40 mg twice daily and was recommended to avoid meloxicam which has been taking for chronic shoulder pain.  Patient is no longer experiencing melena.  He continues to take omeprazole 40 mg twice daily.  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  He is now taking meloxicam every other day which helps to regulate his pain significantly.  He has appointment to see orthopedic for his shoulder pain.  He denies any recurrent episodes of melena.  He denies epigastric pain.  Follow-up visit 12/27/2020 Patient is here for follow-up of peptic ulcer disease.  He is doing remarkably well.  His repeat EGD revealed complete healing of gastric ulcer.  He is currently taking omeprazole 40 mg daily along with meloxicam.  He does not have any GI concerns.  Follow-up visit 01/05/2022 Patient is here for annual follow-up of Barrett's esophagus.  He has short segment Barrett's esophagus without evidence of dysplasia based on last upper endoscopy in 2022.  Patient is currently taking  long-term omeprazole 40 mg 1-2 times daily.  He does not have any GI concerns today.  Follow up visit 03/30/2023 by yeah Patient is here for follow-up of short segment Barrett's esophagus without dysplasia.  He is currently taking omeprazole 40 mg 1-2 times daily.  He does not have any GI concerns today.  He is due for surveillance EGD and surveillance colonoscopy  NSAIDs: Meloxicam  Antiplts/Anticoagulants/Anti thrombotics: None  GI Procedures:  EGD and colonoscopy 07/11/2020 - Normal duodenal bulb and second portion of the duodenum. - Normal stomach. - Salmon-colored mucosa suspicious for short-segment Barrett's esophagus. Biopsied.  - Five 3 to 5 mm polyps in the rectum, in the ascending colon and in the cecum, removed with a cold snare. Resected and retrieved. - Diverticulosis in the sigmoid colon. - Non-bleeding external hemorrhoids. - The examination was otherwise normal.  DIAGNOSIS:  A. GEJ; COLD BIOPSY:  - BARRETT'S ESOPHAGUS.  - NEGATIVE FOR DYSPLASIA AND MALIGNANCY.   B. COLON POLYP X2, CECUM; COLD SNARE:  - TUBULAR ADENOMA, MULTIPLE FRAGMENTS.  - NEGATIVE FOR HIGH-GRADE DYSPLASIA AND MALIGNANCY.   C. COLON POLYP X2, ASCENDING; COLD SNARE:  - TUBULAR ADENOMA, MULTIPLE FRAGMENTS.  - NEGATIVE FOR HIGH-GRADE DYSPLASIA AND MALIGNANCY.   D. RECTUM POLYP; COLD SNARE:  - TUBULAR ADENOMA, MULTIPLE FRAGMENTS.  - NEGATIVE FOR HIGH-GRADE DYSPLASIA AND MALIGNANCY.   Upper endoscopy 04/10/2020 - Normal duodenal bulb and second portion of the duodenum. - Non-bleeding gastric ulcers with a clean ulcer base (Forrest Class III). Biopsied. - Normal gastric  body. Biopsied. - Z-line irregular, 40 cm from the incisors. - Normal gastroesophageal junction and esophagus. DIAGNOSIS:  A. STOMACH, RANDOM; COLD BIOPSY:  - GASTRIC ANTRAL AND OXYNTIC MUCOSA WITH FEATURES OF REACTIVE  GASTROPATHY.  - NEGATIVE FOR H. PYLORI, DYSPLASIA, AND MALIGNANCY.   Past Medical History:  Diagnosis  Date   Arthritis    Bicuspid aortic valve    Hypercalcemia 02/28/2020   Hyperlipidemia    Hypertension    Multilevel degenerative disc disease     Past Surgical History:  Procedure Laterality Date   COLONOSCOPY WITH PROPOFOL N/A 07/11/2020   Procedure: COLONOSCOPY WITH PROPOFOL;  Surgeon: Toney Reil, MD;  Location: [facility];  Service: Gastroenterology;  Laterality: N/A;   ESOPHAGOGASTRODUODENOSCOPY (EGD) WITH PROPOFOL N/A 04/10/2020   Procedure: ESOPHAGOGASTRODUODENOSCOPY (EGD) WITH PROPOFOL;  Surgeon: Toney Reil, MD;  Location: [facility];  Service: Gastroenterology;  Laterality: N/A;   ESOPHAGOGASTRODUODENOSCOPY (EGD) WITH PROPOFOL N/A 07/11/2020   Procedure: ESOPHAGOGASTRODUODENOSCOPY (EGD) WITH PROPOFOL;  Surgeon: Toney Reil, MD;  Location: [facility];  Service: Gastroenterology;  Laterality: N/A;   JOINT REPLACEMENT     POLYPECTOMY  07/11/2020   Procedure: POLYPECTOMY;  Surgeon: Toney Reil, MD;  Location: [facility];  Service: Gastroenterology;;   tkrx2 Bilateral    TONSILLECTOMY     varicle      Current Outpatient Medications:    amLODipine (NORVASC) 5 MG tablet, Take 1 tablet by mouth daily., Disp: , Rfl:    aspirin EC 81 MG tablet, Take 1 tablet by mouth daily., Disp: , Rfl:    atorvastatin (LIPITOR) 20 MG tablet, Take 20 mg by mouth daily., Disp: , Rfl:    azelastine (OPTIVAR) 0.05 % ophthalmic solution, azelastine 0.05 % eye drops  PLACE 1 DROP INTO BOTH EYES 2 TIMES DAILY AS NEEDED FOR ALLERGIES., Disp: , Rfl:    Biotin 2.5 MG TABS, biotin 2,500 mcg tablet  Take two 2500 mcg caps daily = 5000 mcg, Disp: , Rfl:    Chromium Picolinate 400 MCG TABS, chromium picolinate 400 mcg tablet, Disp: , Rfl:    cyclobenzaprine (FLEXERIL) 10 MG tablet, Take 10 mg by mouth 3 (three) times daily as needed., Disp: , Rfl:    fluticasone (FLONASE) 50 MCG/ACT nasal spray, fluticasone propionate 50 mcg/actuation nasal  spray,suspension, Disp: , Rfl:    hydrochlorothiazide (HYDRODIURIL) 25 MG tablet, Take 25 mg by mouth daily., Disp: , Rfl:    lisinopril (ZESTRIL) 40 MG tablet, Take 1 tablet by mouth daily., Disp: , Rfl:    meloxicam (MOBIC) 15 MG tablet, Take 1 tablet by mouth daily., Disp: , Rfl:    Multiple Vitamin (MULTIVITAMIN) capsule, Take 1 capsule by mouth daily., Disp: , Rfl:    Na Sulfate-K Sulfate-Mg Sulf 17.5-3.13-1.6 GM/177ML SOLN, Take 354 mLs by mouth once for 1 dose., Disp: 354 mL, Rfl: 0   Omega-3 Fatty Acids (FISH OIL) 1000 MG CAPS, Take by mouth., Disp: , Rfl:    traMADol (ULTRAM) 50 MG tablet, Take 50 mg by mouth every 6 (six) hours as needed., Disp: , Rfl:    omeprazole (PRILOSEC) 40 MG capsule, Take 1 capsule (40 mg total) by mouth 2 (two) times daily., Disp: 180 capsule, Rfl: 0   tiZANidine (ZANAFLEX) 4 MG tablet, , Disp: , Rfl:   Family History  Problem Relation Age of Onset   Heart failure Father    Stroke Father      Social History   Tobacco Use   Smoking status: Never  Smokeless tobacco: Never  Vaping Use   Vaping status: Never Used  Substance Use Topics   Alcohol use: Yes    Comment: socially (none since 10/21)   Drug use: No    Allergies as of 03/30/2023 - Review Complete 03/30/2023  Allergen Reaction Noted   Ciprofloxacin Other (See Comments) 03/31/2019    Review of Systems:    All systems reviewed and negative except where noted in HPI.   Physical Exam:  BP 108/67 (BP Location: Left Arm, Patient Position: Sitting, Cuff Size: Normal)   Pulse 83   Temp 97.8 F (36.6 C) (Oral)   Ht 5\' 10"  (1.778 m)   Wt 192 lb (87.1 kg)   BMI 27.55 kg/m  No LMP for male patient.  General:   Alert,  Well-developed, well-nourished, pleasant and cooperative in NAD Head:  Normocephalic and atraumatic. Eyes:  Sclera clear, no icterus.   Conjunctiva pink. Ears:  Normal auditory acuity. Nose:  No deformity, discharge, or lesions. Mouth:  No deformity or lesions,oropharynx  pink & moist. Neck:  Supple; no masses or thyromegaly. Lungs:  Respirations even and unlabored.  Clear throughout to auscultation.   No wheezes, crackles, or rhonchi. No acute distress. Heart:  Regular rate and rhythm; no murmurs, clicks, rubs, or gallops. Abdomen:  Normal bowel sounds. Soft, non-tender and non-distended without masses, hepatosplenomegaly or hernias noted.  No guarding or rebound tenderness.   Rectal: Not performed Msk:  Symmetrical without gross deformities. Good, equal movement & strength bilaterally. Pulses:  Normal pulses noted. Extremities:  No clubbing or edema.  No cyanosis. Neurologic:  Alert and oriented x3;  grossly normal neurologically. Skin:  Intact without significant lesions or rashes. No jaundice. Psych:  Alert and cooperative. Normal mood and affect.  Imaging Studies: No abdominal imaging  Assessment and Plan:   Jeremiah Gates is a 72 y.o. pleasant Caucasian male with history of arthritis, hypertension, known history of NSAID induced gastric ulcers s/p confirmed healing on repeat EGD  Short segment Barrett's esophagus with no evidence of dysplasia Continue long-term omeprazole 40 mg daily before meals  Continue over-the-counter vitamin B-12 and vitamin D levels Recommend surveillance upper endoscopy in 06/2023  Tubular adenomas of the colon Recommend surveillance colonoscopy in 06/2023   Follow up as needed   Arlyss Repress, MD

## 2023-04-19 ENCOUNTER — Encounter: Payer: Self-pay | Admitting: Gastroenterology

## 2023-04-19 ENCOUNTER — Telehealth: Payer: Self-pay

## 2023-04-19 NOTE — Telephone Encounter (Signed)
Called patient he states his throat is sore and has swollen lymph nodes and wants to reschedule his procedure. He only wants to go to [place]. Reschedule to 05/25/2023. Sent out new instructions. Sent a message to kim in [place] so she can get patient moved

## 2023-04-19 NOTE — Telephone Encounter (Signed)
Pt left message to cancel procedure requesting call back to get rescheduled

## 2023-05-25 ENCOUNTER — Ambulatory Visit: Payer: Self-pay | Admitting: Anesthesiology

## 2023-05-25 ENCOUNTER — Ambulatory Visit: Payer: Medicare PPO | Admitting: Anesthesiology

## 2023-05-25 ENCOUNTER — Encounter
Admission: RE | Disposition: A | Discharge status: Home / Self Care | Payer: Self-pay | Source: Home / Self Care | Attending: Gastroenterology

## 2023-05-25 ENCOUNTER — Ambulatory Visit
Admission: RE | Admit: 2023-05-25 | Discharge: 2023-05-25 | Disposition: A | Discharge status: Home / Self Care | Payer: Medicare PPO | Attending: Gastroenterology | Admitting: Gastroenterology

## 2023-05-25 ENCOUNTER — Encounter: Payer: Self-pay | Admitting: Gastroenterology

## 2023-05-25 ENCOUNTER — Other Ambulatory Visit: Payer: Self-pay

## 2023-05-25 DIAGNOSIS — Z1211 Encounter for screening for malignant neoplasm of colon: Secondary | ICD-10-CM | POA: Diagnosis not present

## 2023-05-25 DIAGNOSIS — K2270 Barrett's esophagus without dysplasia: Secondary | ICD-10-CM

## 2023-05-25 DIAGNOSIS — D123 Benign neoplasm of transverse colon: Secondary | ICD-10-CM | POA: Insufficient documentation

## 2023-05-25 DIAGNOSIS — Z09 Encounter for follow-up examination after completed treatment for conditions other than malignant neoplasm: Secondary | ICD-10-CM | POA: Insufficient documentation

## 2023-05-25 DIAGNOSIS — I10 Essential (primary) hypertension: Secondary | ICD-10-CM | POA: Insufficient documentation

## 2023-05-25 DIAGNOSIS — K644 Residual hemorrhoidal skin tags: Secondary | ICD-10-CM | POA: Diagnosis not present

## 2023-05-25 DIAGNOSIS — K562 Volvulus: Secondary | ICD-10-CM | POA: Diagnosis not present

## 2023-05-25 DIAGNOSIS — D122 Benign neoplasm of ascending colon: Secondary | ICD-10-CM | POA: Insufficient documentation

## 2023-05-25 DIAGNOSIS — K635 Polyp of colon: Secondary | ICD-10-CM

## 2023-05-25 HISTORY — PX: COLONOSCOPY WITH PROPOFOL: SHX5780

## 2023-05-25 HISTORY — DX: Thoracic aortic ectasia: I77.810

## 2023-05-25 HISTORY — DX: Nonrheumatic aortic (valve) insufficiency: I35.1

## 2023-05-25 HISTORY — PX: ESOPHAGOGASTRODUODENOSCOPY (EGD) WITH PROPOFOL: SHX5813

## 2023-05-25 HISTORY — DX: Barrett's esophagus without dysplasia: K22.70

## 2023-05-25 SURGERY — COLONOSCOPY WITH PROPOFOL
Anesthesia: General

## 2023-05-25 MED ORDER — SODIUM CHLORIDE 0.9 % IV SOLN
INTRAVENOUS | Status: DC
Start: 2023-05-25 — End: 2023-05-25

## 2023-05-25 MED ORDER — LACTATED RINGERS IV SOLN: INTRAVENOUS | Status: DC | PRN

## 2023-05-25 MED ORDER — LIDOCAINE HCL (CARDIAC) PF 100 MG/5ML IV SOSY
PREFILLED_SYRINGE | INTRAVENOUS | Status: DC | PRN
  Administered 2023-05-25: 80 mg via INTRAVENOUS

## 2023-05-25 MED ORDER — PROPOFOL 10 MG/ML IV BOLUS
INTRAVENOUS | Status: DC | PRN
  Administered 2023-05-25: 40 mg via INTRAVENOUS
  Administered 2023-05-25 (×3): 30 mg via INTRAVENOUS
  Administered 2023-05-25: 40 mg via INTRAVENOUS
  Administered 2023-05-25 (×2): 30 mg via INTRAVENOUS
  Administered 2023-05-25: 80 mg via INTRAVENOUS
  Administered 2023-05-25: 30 mg via INTRAVENOUS
  Administered 2023-05-25: 20 mg via INTRAVENOUS
  Administered 2023-05-25 (×2): 30 mg via INTRAVENOUS
  Administered 2023-05-25: 20 mg via INTRAVENOUS

## 2023-05-25 SURGICAL SUPPLY — 10 items
BLOCK BITE 60FR ADLT L/F GRN (MISCELLANEOUS) ×1 IMPLANT
FCP ESCP3.2XJMB 240X2.8X (MISCELLANEOUS) ×1 IMPLANT
FORCEPS BIOP RAD 4 LRG CAP 4 (CUTTING FORCEPS) IMPLANT
FORCEPS ESCP3.2XJMB 240X2.8X (MISCELLANEOUS) IMPLANT
GOWN CVR UNV OPN BCK APRN NK (MISCELLANEOUS) ×2 IMPLANT
KIT PRC NS LF DISP ENDO (KITS) ×1 IMPLANT
MANIFOLD NEPTUNE II (INSTRUMENTS) ×1 IMPLANT
SNARE COLD EXACTO (MISCELLANEOUS) IMPLANT
TRAP ETRAP POLY (MISCELLANEOUS) IMPLANT
WATER STERILE IRR 250ML POUR (IV SOLUTION) ×1 IMPLANT

## 2023-05-25 NOTE — Anesthesia Preprocedure Evaluation (Addendum)
Anesthesia Evaluation  Patient identified by MRN, date of birth, ID band Patient awake    Reviewed: Allergy & Precautions, H&P , NPO status , Patient's Chart, lab work & pertinent test results  Airway Mallampati: III  TM Distance: >3 FB Neck ROM: Full    Dental no notable dental hx.  Crowns:   Pulmonary neg pulmonary ROS   Pulmonary exam normal breath sounds clear to auscultation       Cardiovascular hypertension, Normal cardiovascular exam Rhythm:Regular Rate:Normal  NOTE: bicuspid aortic valve 05-19-23 NYHA class I symptoms. TTE today shows stable findings with left ventricular ejection fraction 55%; bicuspid aortic valve with peak aortic valve velocity 2.7 m/s, peak gradient 30 mmHg, mean gradient 16 mmHg, valve area 2.3 cm with moderate aortic insufficiency; dimensionless index 0.42; ascending aortic diameter 4.2 cm by my measurement. No indication for aortic valve intervention. Plan repeat echocardiogram in 1 year.    Neuro/Psych negative neurological ROS  negative psych ROS   GI/Hepatic negative GI ROS, Neg liver ROS,,,Short segment barretts esophagus   Endo/Other  negative endocrine ROS    Renal/GU negative Renal ROS  negative genitourinary   Musculoskeletal negative musculoskeletal ROS (+) Arthritis ,    Abdominal   Peds negative pediatric ROS (+)  Hematology negative hematology ROS (+)   Anesthesia Other Findings   Medical History  Hypertension  Arthritis Hyperlipidemia  Bicuspid aortic valve Multilevel degenerative disc disease Hypercalcemia Ascending aorta dilatation, 4.2 cm Short segment barretts esophagus   Reproductive/Obstetrics negative OB ROS                              Anesthesia Physical Anesthesia Plan  ASA: 3  Anesthesia Plan: General   Post-op Pain Management:    Induction: Intravenous  PONV Risk Score and Plan:   Airway Management Planned:  Natural Airway and Nasal Cannula  Additional Equipment:   Intra-op Plan:   Post-operative Plan:   Informed Consent: I have reviewed the patients History and Physical, chart, labs and discussed the procedure including the risks, benefits and alternatives for the proposed anesthesia with the patient or authorized representative who has indicated his/her understanding and acceptance.     Dental Advisory Given  Plan Discussed with: Anesthesiologist, CRNA and Surgeon  Anesthesia Plan Comments: (Patient consented for risks of anesthesia including but not limited to:  - adverse reactions to medications - risk of airway placement if required - damage to eyes, teeth, lips or other oral mucosa - nerve damage due to positioning  - sore throat or hoarseness - Damage to heart, brain, nerves, lungs, other parts of body or loss of life  Patient voiced understanding and assent.)         Anesthesia Quick Evaluation

## 2023-05-25 NOTE — Transfer of Care (Signed)
Immediate Anesthesia Transfer of Care Note  Patient: Jeremiah Gates  Procedure(s) Performed: COLONOSCOPY WITH PROPOFOL with polypectomy ESOPHAGOGASTRODUODENOSCOPY (EGD) WITH PROPOFOL  Patient Location: PACU  Anesthesia Type:General  Level of Consciousness: sedated  Airway & Oxygen Therapy: Patient Spontanous Breathing  Post-op Assessment: Report given to RN and Post -op Vital signs reviewed and stable  Post vital signs: Reviewed and stable  Last Vitals: See PACU flow sheet.  All nl V/S Vitals Value Taken Time  BP    Temp    Pulse    Resp    SpO2      Last Pain:  Vitals:   05/25/23 0817  TempSrc: Temporal  PainSc: 0-No pain         Complications: No notable events documented.

## 2023-05-25 NOTE — Op Note (Signed)
[facility] Gastroenterology Patient Name: Jeremiah Gates Procedure Date: 05/25/2023 8:51 AM MRN: [id number] Account #: [id number] Date of Birth: December 30, 1950 Admit Type: Outpatient Age: 72 Room: [facility] OR ROOM 01 Gender: Male Note Status: Finalized Instrument Name: [id number] Procedure:             Colonoscopy Indications:           Surveillance: Personal history of adenomatous polyps                         on last colonoscopy 3 years ago, Last colonoscopy:                         January 2022 Providers:             Toney Reil MD, MD Referring MD:          No Local Md, MD (Referring MD) Medicines:             General Anesthesia Complications:         No immediate complications. Estimated blood loss: None. Procedure:             Pre-Anesthesia Assessment:                        - Prior to the procedure, a History and Physical was                         performed, and patient medications and allergies were                         reviewed. The patient is competent. The risks and                         benefits of the procedure and the sedation options and                         risks were discussed with the patient. All questions                         were answered and informed consent was obtained.                         Patient identification and proposed procedure were                         verified by the physician, the nurse, the                         anesthesiologist, the anesthetist and the technician                         in the pre-procedure area in the procedure room in the                         endoscopy suite. Mental Status Examination: alert and                         oriented. Airway Examination: normal oropharyngeal  airway and neck mobility. Respiratory Examination:                         clear to auscultation. CV Examination: normal.                         Prophylactic Antibiotics: The patient does not  require                         prophylactic antibiotics. Prior Anticoagulants: The                         patient has taken no anticoagulant or antiplatelet                         agents. ASA Grade Assessment: III - A patient with                         severe systemic disease. After reviewing the risks and                         benefits, the patient was deemed in satisfactory                         condition to undergo the procedure. The anesthesia                         plan was to use general anesthesia. Immediately prior                         to administration of medications, the patient was                         re-assessed for adequacy to receive sedatives. The                         heart rate, respiratory rate, oxygen saturations,                         blood pressure, adequacy of pulmonary ventilation, and                         response to care were monitored throughout the                         procedure. The physical status of the patient was                         re-assessed after the procedure.                        After obtaining informed consent, the colonoscope was                         passed under direct vision. Throughout the procedure,                         the patient's blood pressure, pulse, and oxygen  saturations were monitored continuously. The                         Colonoscope was introduced through the anus and                         advanced to the the cecum, identified by appendiceal                         orifice and ileocecal valve. The colonoscopy was                         performed with moderate difficulty due to significant                         looping. Successful completion of the procedure was                         aided by applying abdominal pressure. The patient                         tolerated the procedure well. The quality of the bowel                         preparation was evaluated using  the BBPS [facility] Bowel                         Preparation Scale) with scores of: Right Colon = 3,                         Transverse Colon = 3 and Left Colon = 3 (entire mucosa                         seen well with no residual staining, small fragments                         of stool or opaque liquid). The total BBPS score                         equals 9. The ileocecal valve, appendiceal orifice,                         and rectum were photographed. Findings:      The perianal and digital rectal examinations were normal. Pertinent       negatives include normal sphincter tone and no palpable rectal lesions.      A diminutive polyp was found in the ascending colon. The polyp was       sessile. The polyp was removed with a jumbo cold forceps. Resection and       retrieval were complete.      A 6 mm polyp was found in the transverse colon. The polyp was sessile.       The polyp was removed with a cold snare. Resection and retrieval were       complete. Estimated blood loss: none.      Non-bleeding external hemorrhoids were found during retroflexion. The       hemorrhoids were medium-sized. Impression:            -  One diminutive polyp in the ascending colon, removed                         with a jumbo cold forceps. Resected and retrieved.                        - One 6 mm polyp in the transverse colon, removed with                         a cold snare. Resected and retrieved.                        - Non-bleeding external hemorrhoids. Recommendation:        - Discharge patient to home (with escort).                        - Resume previous diet today.                        - Continue present medications.                        - Await pathology results.                        - Repeat colonoscopy in 5 years for surveillance based                         on pathology results. Procedure Code(s):     --- Professional ---                        [phone], Colonoscopy, flexible; with removal  of                         tumor(s), polyp(s), or other lesion(s) by snare                         technique                        45380, 59, Colonoscopy, flexible; with biopsy, single                         or multiple Diagnosis Code(s):     --- Professional ---                        Z86.010, Personal history of colonic polyps                        D12.2, Benign neoplasm of ascending colon                        D12.3, Benign neoplasm of transverse colon (hepatic                         flexure or splenic flexure)                        K64.4, Residual hemorrhoidal skin tags CPT copyright 2022 American Medical Association. All rights reserved.  The codes documented in this report are preliminary and upon coder review may  be revised to meet current compliance requirements. Dr. Libby Maw Toney Reil MD, MD 05/25/2023 9:41:57 AM This report has been signed electronically. Number of Addenda: 0 Note Initiated On: 05/25/2023 8:51 AM Scope Withdrawal Time: 0 hours 10 minutes 46 seconds  Total Procedure Duration: 0 hours 20 minutes 7 seconds  Estimated Blood Loss:  Estimated blood loss: none.      [facility]

## 2023-05-25 NOTE — Anesthesia Postprocedure Evaluation (Signed)
Anesthesia Post Note  Patient: Jeremiah Gates  Procedure(s) Performed: COLONOSCOPY WITH PROPOFOL with polypectomy ESOPHAGOGASTRODUODENOSCOPY (EGD) WITH PROPOFOL  Patient location during evaluation: PACU Anesthesia Type: General Level of consciousness: awake and alert Pain management: pain level controlled Vital Signs Assessment: post-procedure vital signs reviewed and stable Respiratory status: spontaneous breathing, nonlabored ventilation, respiratory function stable and patient connected to nasal cannula oxygen Cardiovascular status: blood pressure returned to baseline and stable Postop Assessment: no apparent nausea or vomiting Anesthetic complications: no   No notable events documented.   Last Vitals:  Vitals:   05/25/23 0939 05/25/23 0950  BP: 98/69 107/66  Pulse: 81 75  Resp: 18 20  Temp: 36.6 C 36.6 C  SpO2: 95% 98%    Last Pain:  Vitals:   05/25/23 0950  TempSrc:   PainSc: 0-No pain                 Kamil Hanigan C Yuvin Bussiere

## 2023-05-25 NOTE — H&P (Signed)
Arlyss Repress, MD [address]  [address]  [place], [place] [address]  Main: [phone]  Fax: [phone] Pager: [phone]  Primary Care Physician:  Ardyth Man, PA-C Primary Gastroenterologist:  Dr. Arlyss Repress  Pre-Procedure History & Physical: HPI:  Jeremiah Gates is a 72 y.o. male is here for an endoscopy and colonoscopy.   Past Medical History:  Diagnosis Date   Arthritis    Ascending aorta dilatation (HCC)    Bicuspid aortic valve    Hypercalcemia 02/28/2020   Hyperlipidemia    Hypertension    Moderate aortic valve regurgitation    Multilevel degenerative disc disease    Short-segment Barrett's esophagus     Past Surgical History:  Procedure Laterality Date   COLONOSCOPY WITH PROPOFOL N/A 07/11/2020   Procedure: COLONOSCOPY WITH PROPOFOL;  Surgeon: Toney Reil, MD;  Location: [facility];  Service: Gastroenterology;  Laterality: N/A;   ESOPHAGOGASTRODUODENOSCOPY (EGD) WITH PROPOFOL N/A 04/10/2020   Procedure: ESOPHAGOGASTRODUODENOSCOPY (EGD) WITH PROPOFOL;  Surgeon: Toney Reil, MD;  Location: [facility] ENDOSCOPY;  Service: Gastroenterology;  Laterality: N/A;   ESOPHAGOGASTRODUODENOSCOPY (EGD) WITH PROPOFOL N/A 07/11/2020   Procedure: ESOPHAGOGASTRODUODENOSCOPY (EGD) WITH PROPOFOL;  Surgeon: Toney Reil, MD;  Location: [facility];  Service: Gastroenterology;  Laterality: N/A;   JOINT REPLACEMENT     POLYPECTOMY  07/11/2020   Procedure: POLYPECTOMY;  Surgeon: Toney Reil, MD;  Location: [facility];  Service: Gastroenterology;;   tkrx2 Bilateral    TONSILLECTOMY     varicle      Prior to Admission medications   Medication Sig Start Date End Date Taking? Authorizing Provider  amLODipine (NORVASC) 5 MG tablet Take 1 tablet by mouth daily.   Yes [provider]  aspirin EC 81 MG tablet Take 1 tablet by mouth daily.   Yes [provider]  atorvastatin (LIPITOR) 20 MG tablet  Take 20 mg by mouth daily.   Yes [provider]  Biotin 2.5 MG TABS biotin 2,500 mcg tablet  Take two 2500 mcg caps daily = 5000 mcg   Yes [provider]  Chromium Picolinate 400 MCG TABS chromium picolinate 400 mcg tablet   Yes [provider]  cyclobenzaprine (FLEXERIL) 10 MG tablet Take 10 mg by mouth 3 (three) times daily as needed. 10/16/21  Yes [provider]  fluticasone (FLONASE) 50 MCG/ACT nasal spray fluticasone propionate 50 mcg/actuation nasal spray,suspension   Yes [provider]  hydrochlorothiazide (HYDRODIURIL) 25 MG tablet Take 25 mg by mouth daily.   Yes [provider]  lisinopril (ZESTRIL) 40 MG tablet Take 1 tablet by mouth daily. 09/04/21  Yes [provider]  meloxicam (MOBIC) 15 MG tablet Take 1 tablet by mouth daily. 08/22/21  Yes [provider]  Multiple Vitamin (MULTIVITAMIN) capsule Take 1 capsule by mouth daily.   Yes [provider]  Omega-3 Fatty Acids (FISH OIL) 1000 MG CAPS Take by mouth.   Yes [provider]  omeprazole (PRILOSEC) 40 MG capsule Take 1 capsule (40 mg total) by mouth 2 (two) times daily. 03/30/23  Yes Ketzia Guzek, Loel Dubonnet, MD  traMADol (ULTRAM) 50 MG tablet Take 50 mg by mouth every 6 (six) hours as needed. 10/16/21  Yes [provider]  azelastine (OPTIVAR) 0.05 % ophthalmic solution azelastine 0.05 % eye drops  PLACE 1 DROP INTO BOTH EYES 2 TIMES DAILY AS NEEDED FOR ALLERGIES. Patient not taking: Reported on 04/19/2023 03/31/21   [provider]  tiZANidine (ZANAFLEX) 4  MG tablet     [provider]    Allergies as of 03/30/2023 - Review Complete 03/30/2023  Allergen Reaction Noted   Ciprofloxacin Other (See Comments) 03/31/2019    Family History  Problem Relation Age of Onset   Heart failure Father    Stroke Father     Social History   Socioeconomic History   Marital status: Married    Spouse name: Not on file    Number of children: Not on file   Years of education: Not on file   Highest education level: Not on file  Occupational History   Not on file  Tobacco Use   Smoking status: Never   Smokeless tobacco: Never  Vaping Use   Vaping status: Never Used  Substance and Sexual Activity   Alcohol use: Yes    Comment: socially (none since 10/21)   Drug use: No   Sexual activity: Not on file  Other Topics Concern   Not on file  Social History Narrative   Not on file   Social Determinants of Health   Financial Resource Strain: Low Risk  (02/26/2023)   Received from [facility]   Overall Financial Resource Strain (CARDIA)    Difficulty of Paying Living Expenses: Not hard at all  Food Insecurity: No Food Insecurity (02/26/2023)   Received from [facility]   Hunger Vital Sign    Worried About Running Out of Food in the Last Year: Never true    Ran Out of Food in the Last Year: Never true  Transportation Needs: No Transportation Needs (02/26/2023)   Received from [facility] - Transportation    In the past 12 months, has lack of transportation kept you from medical appointments or from getting medications?: No    Lack of Transportation (Non-Medical): No  Physical Activity: Not on file  Stress: Not on file  Social Connections: Not on file  Intimate Partner Violence: Not on file    Review of Systems: See HPI, otherwise negative ROS  Physical Exam: BP 113/71   Temp 98.6 F (37 C) (Temporal)   Resp 15   Ht 5\' 10"  (1.778 m)   Wt 88.7 kg   SpO2 96%   BMI 28.07 kg/m  General:   Alert,  pleasant and cooperative in NAD Head:  Normocephalic and atraumatic. Neck:  Supple; no masses or thyromegaly. Lungs:  Clear throughout to auscultation.    Heart:  Regular rate and rhythm. Abdomen:  Soft, nontender and nondistended. Normal bowel sounds, without guarding, and without rebound.   Neurologic:  Alert and  oriented x4;  grossly  normal neurologically.  Impression/Plan: Jeremiah Gates is here for an endoscopy and colonoscopy to be performed for surveillance of barrett's esophagus, adenoma colon  Risks, benefits, limitations, and alternatives regarding  endoscopy and colonoscopy have been reviewed with the patient.  Questions have been answered.  All parties agreeable.   Lannette Donath, MD  05/25/2023, 8:47 AM

## 2023-05-25 NOTE — Op Note (Signed)
[facility] Gastroenterology Patient Name: Jeremiah Gates Procedure Date: 05/25/2023 8:52 AM MRN: [id number] Account #: [id number] Date of Birth: 09-20-1950 Admit Type: Outpatient Age: 72 Room: [facility] OR ROOM 01 Gender: Male Note Status: Finalized Instrument Name: [id number] Procedure:             Upper GI endoscopy Indications:           Screening for Barrett's esophagus Providers:             Toney Reil MD, MD Referring MD:          No Local Md, MD (Referring MD) Medicines:             General Anesthesia Complications:         No immediate complications. Estimated blood loss: None. Procedure:             Pre-Anesthesia Assessment:                        - Prior to the procedure, a History and Physical was                         performed, and patient medications and allergies were                         reviewed. The patient is competent. The risks and                         benefits of the procedure and the sedation options and                         risks were discussed with the patient. All questions                         were answered and informed consent was obtained.                         Patient identification and proposed procedure were                         verified by the physician, the nurse, the                         anesthesiologist, the anesthetist and the technician                         in the pre-procedure area in the procedure room in the                         endoscopy suite. Mental Status Examination: alert and                         oriented. Airway Examination: normal oropharyngeal                         airway and neck mobility. Respiratory Examination:                         clear to auscultation. CV Examination: normal.  Prophylactic Antibiotics: The patient does not require                         prophylactic antibiotics. Prior Anticoagulants: The                         patient has  taken no anticoagulant or antiplatelet                         agents. ASA Grade Assessment: II - A patient with mild                         systemic disease. After reviewing the risks and                         benefits, the patient was deemed in satisfactory                         condition to undergo the procedure. The anesthesia                         plan was to use general anesthesia. Immediately prior                         to administration of medications, the patient was                         re-assessed for adequacy to receive sedatives. The                         heart rate, respiratory rate, oxygen saturations,                         blood pressure, adequacy of pulmonary ventilation, and                         response to care were monitored throughout the                         procedure. The physical status of the patient was                         re-assessed after the procedure.                        After obtaining informed consent, the endoscope was                         passed under direct vision. Throughout the procedure,                         the patient's blood pressure, pulse, and oxygen                         saturations were monitored continuously. The Endoscope                         was introduced through the mouth, and advanced to the  second part of duodenum. The upper GI endoscopy was                         accomplished without difficulty. The patient tolerated                         the procedure well. Findings:      The duodenal bulb and second portion of the duodenum were normal.      The entire examined stomach was normal.      The cardia and gastric fundus were normal on retroflexion.      There were esophageal mucosal changes secondary to established       short-segment Barrett's disease present in the lower third of the       esophagus. The maximum longitudinal extent of these mucosal changes was       1 cm  in length. Mucosa was biopsied with a cold forceps for histology.       One specimen bottle was sent to pathology. Estimated blood loss was       minimal. Impression:            - Normal duodenal bulb and second portion of the                         duodenum.                        - Normal stomach.                        - Esophageal mucosal changes secondary to established                         short-segment Barrett's disease. Biopsied. Recommendation:        - Continue present medications.                        - Follow an antireflux regimen indefinitely.                        - Proceed with colonoscopy as scheduled                        See colonoscopy report Procedure Code(s):     --- Professional ---                        [id number], Esophagogastroduodenoscopy, flexible,                         transoral; with biopsy, single or multiple Diagnosis Code(s):     --- Professional ---                        K22.70, Barrett's esophagus without dysplasia                        Z13.810, Encounter for screening for upper                         gastrointestinal disorder CPT copyright 2022 American Medical Association. All rights reserved. The codes documented in this report are preliminary and upon coder  review may  be revised to meet current compliance requirements. Dr. Libby Maw Toney Reil MD, MD 05/25/2023 9:14:01 AM This report has been signed electronically. Number of Addenda: 0 Note Initiated On: 05/25/2023 8:52 AM Total Procedure Duration: 0 hours 9 minutes 1 second  Estimated Blood Loss:  Estimated blood loss: none.      [facility]

## 2023-05-26 ENCOUNTER — Encounter: Payer: Self-pay | Admitting: Gastroenterology

## 2023-05-27 LAB — SURGICAL PATHOLOGY

## 2023-05-28 ENCOUNTER — Encounter: Payer: Self-pay | Admitting: Gastroenterology

## 2023-05-28 ENCOUNTER — Telehealth: Payer: Self-pay

## 2023-05-28 MED ORDER — OMEPRAZOLE 40 MG PO CPDR: 40.0000 mg | DELAYED_RELEASE_CAPSULE | Freq: Two times a day (BID) | ORAL | 3 refills | Status: AC

## 2023-05-28 NOTE — Telephone Encounter (Signed)
Patient verbalized understanding of results. He states that at his last appointment he though he was supposed to go to omeprazole once a day and his PCP was supposed to take over refilling it. He states she refilled it for once a day for him. Sent medication for twice a day to the pharmacy.

## 2023-05-28 NOTE — Telephone Encounter (Signed)
-----   Message from [facility] sent at 05/28/2023 10:16 AM EST ----- Please inform patient that the pathology results from upper endoscopy confirms short segment Barrett's esophagus without any precancerous changes.  Therefore, recommend long-term omeprazole 40 mg twice daily before meals and surveillance endoscopy in 5 years  Rohini Vanga

## 2024-05-24 NOTE — Progress Notes (Signed)
 Gastroenterology Consultation  Referring Provider:     Johnie Perkins, PA-C Primary Care Physician:  Johnie Perkins, PA-C Primary Gastroenterologist:  Dr. Melany     Reason for Consultation:     Establish care for polyps, Fhx CRC and Barrett's        HPI:   Jeremiah Gates is a 73 y.o. y/o male referred for consultation & management of polyps, Fhx CRC and Barrett's by Dr. Johnie Perkins, PA-C.   He had EGD/colo on 05/25/23, which showed 1cm of non-dysplastic Barrets and two APs. There is a family history of CRC in 2 grandparents, and he has been recommended to have both EGD and colonoscopy again in 2029.  He has a remote history of upper GI bleed due to multiple antral ulcers, largest 5 mm and clean-based, H. pylori negative. He is now maintained on Prilosec 40 mg twice daily, which he takes correctly. He currently takes aspirin 81 mg daily.  He denies any prior history of NSAID use.  He is not on blood thinners.  He denies dysphagia.  Past Medical History:  Diagnosis Date   Arthritis    Ascending aorta dilatation    Bicuspid aortic valve    Hypercalcemia 02/28/2020   Hyperlipidemia    Hypertension    Moderate aortic valve regurgitation    Multilevel degenerative disc disease    Short-segment Barrett's esophagus     Past Surgical History:  Procedure Laterality Date   COLONOSCOPY WITH PROPOFOL  N/A 07/11/2020   Procedure: COLONOSCOPY WITH PROPOFOL ;  Surgeon: Unk Corinn Skiff, MD;  Location: [facility];  Service: Gastroenterology;  Laterality: N/A;   COLONOSCOPY WITH PROPOFOL  N/A 05/25/2023   Procedure: COLONOSCOPY WITH PROPOFOL  with polypectomy;  Surgeon: Unk Corinn Skiff, MD;  Location: [facility];  Service: Endoscopy;  Laterality: N/A;   ESOPHAGOGASTRODUODENOSCOPY (EGD) WITH PROPOFOL  N/A 04/10/2020   Procedure: ESOPHAGOGASTRODUODENOSCOPY (EGD) WITH PROPOFOL ;  Surgeon: Unk Corinn Skiff, MD;  Location: [facility] ENDOSCOPY;  Service:  Gastroenterology;  Laterality: N/A;   ESOPHAGOGASTRODUODENOSCOPY (EGD) WITH PROPOFOL  N/A 07/11/2020   Procedure: ESOPHAGOGASTRODUODENOSCOPY (EGD) WITH PROPOFOL ;  Surgeon: Unk Corinn Skiff, MD;  Location: [facility];  Service: Gastroenterology;  Laterality: N/A;   ESOPHAGOGASTRODUODENOSCOPY (EGD) WITH PROPOFOL  N/A 05/25/2023   Procedure: ESOPHAGOGASTRODUODENOSCOPY (EGD) WITH PROPOFOL ;  Surgeon: Unk Corinn Skiff, MD;  Location: [facility];  Service: Endoscopy;  Laterality: N/A;   JOINT REPLACEMENT     POLYPECTOMY  07/11/2020   Procedure: POLYPECTOMY;  Surgeon: Unk Corinn Skiff, MD;  Location: [facility];  Service: Gastroenterology;;   tkrx2 Bilateral    TONSILLECTOMY     varicle      Prior to Admission medications   Medication Sig Start Date End Date Taking? Authorizing Provider  amLODipine (NORVASC) 5 MG tablet Take 1 tablet by mouth daily.    [provider]  aspirin EC 81 MG tablet Take 1 tablet by mouth daily.    [provider]  atorvastatin (LIPITOR) 20 MG tablet Take 20 mg by mouth daily.    [provider]  Biotin 2.5 MG TABS biotin 2,500 mcg tablet  Take two 2500 mcg caps daily = 5000 mcg    [provider]  Chromium Picolinate 400 MCG TABS chromium picolinate 400 mcg tablet    [provider]  cyclobenzaprine (FLEXERIL) 10 MG tablet Take 10 mg by mouth 3 (three) times daily as needed. 10/16/21   [provider]  fluticasone (FLONASE) 50 MCG/ACT nasal spray fluticasone propionate 50 mcg/actuation nasal  spray,suspension    [provider]  hydrochlorothiazide (HYDRODIURIL) 25 MG tablet Take 25 mg by mouth daily.    [provider]  lisinopril (ZESTRIL) 40 MG tablet Take 1 tablet by mouth daily. 09/04/21   [provider]  meloxicam  (MOBIC ) 15 MG tablet Take 1 tablet by mouth daily. 08/22/21   [provider]  Multiple Vitamin (MULTIVITAMIN) capsule Take 1 capsule by  mouth daily.    [provider]  Omega-3 Fatty Acids (FISH OIL) 1000 MG CAPS Take by mouth.    [provider]  omeprazole  (PRILOSEC) 40 MG capsule Take 1 capsule (40 mg total) by mouth 2 (two) times daily. 05/28/23   Vanga, Rohini Reddy, MD  traMADol (ULTRAM) 50 MG tablet Take 50 mg by mouth every 6 (six) hours as needed. 10/16/21   [provider]    Family History  Problem Relation Age of Onset   Heart failure Father    Stroke Father      Social History   Tobacco Use   Smoking status: Never   Smokeless tobacco: Never  Vaping Use   Vaping status: Never Used  Substance Use Topics   Alcohol use: Yes    Comment: socially (none since 10/21)   Drug use: No    Allergies as of 05/25/2024 - Review Complete 05/25/2024  Allergen Reaction Noted   Ciprofloxacin Other (See Comments) 03/31/2019    Review of Systems:    All systems reviewed and negative except where noted in HPI.   Physical Exam:  BP 125/77 (BP Location: Left Arm, Patient Position: Sitting, Cuff Size: Normal)   Pulse 84   Temp 97.9 F (36.6 C) (Oral)   Wt 196 lb 12.8 oz (89.3 kg)   BMI 28.24 kg/m  No LMP for male patient. General:   Alert,  Well-developed, well-nourished, pleasant and cooperative in NAD Head:  Normocephalic and atraumatic. Eyes:  Sclera clear, no icterus.   Conjunctiva pink. Ears:  Normal auditory acuity. Neck:  Supple; no masses or thyromegaly. Lungs:  Respirations even and unlabored.  Clear throughout to auscultation.   No wheezes, crackles, or rhonchi. No acute distress. Heart:  Regular rate and rhythm; no murmurs, clicks, rubs, or gallops. Abdomen:  Normal bowel sounds.  No bruits.  Soft, non-tender and non-distended without masses, hepatosplenomegaly or hernias noted.  No guarding or rebound tenderness.  Negative Carnett sign.   Rectal:  Deferred.  Pulses:  Normal pulses noted. Extremities:  No clubbing or edema.  No cyanosis. Neurologic:  Alert and oriented x3;   grossly normal neurologically. Skin:  Intact without significant lesions or rashes.  No jaundice. Lymph Nodes:  No significant cervical adenopathy. Psych:  Alert and cooperative. Normal mood and affect.  Imaging Studies: No results found.  Assessment and Plan:   Dashel Goines is a 73 y.o. y/o male with a family history of colon cancer, remote history of PUD, now on twice daily PPI with no recurrence as of his last EGD in 12/24, and personal history of colon polyps.  He will be due for repeated upper endoscopy and colonoscopy in 2029.  He should remain on his twice daily PPI, which his PCP is currently providing.  He was asked to return to clinic for any GI concerns, and specifically if dysphagia develops.   Clotilda Schaffer, MD   Note: This dictation was prepared with Dragon dictation along with smaller phrase technology. Any transcriptional errors that result from this process are unintentional.

## 2024-05-25 ENCOUNTER — Encounter: Payer: Self-pay | Admitting: Gastroenterology

## 2024-05-25 ENCOUNTER — Ambulatory Visit: Admitting: Gastroenterology

## 2024-05-25 VITALS — BP 125/77 | HR 84 | Temp 97.9°F | Wt 196.8 lb

## 2024-05-25 DIAGNOSIS — Z800 Family history of malignant neoplasm of digestive organs: Secondary | ICD-10-CM | POA: Insufficient documentation

## 2024-05-25 DIAGNOSIS — K2270 Barrett's esophagus without dysplasia: Secondary | ICD-10-CM | POA: Diagnosis not present

## 2024-05-25 DIAGNOSIS — D122 Benign neoplasm of ascending colon: Secondary | ICD-10-CM

## 2024-05-25 DIAGNOSIS — D123 Benign neoplasm of transverse colon: Secondary | ICD-10-CM | POA: Diagnosis not present

## 2024-05-25 NOTE — Assessment & Plan Note (Signed)
 In two grandparents.
